# Patient Record
Sex: Female | Born: 1965
Health system: Southern US, Community
[De-identification: ages and names within clinical notes are randomized; demographics above are authoritative.]

## PROBLEM LIST (undated history)

## (undated) DIAGNOSIS — K635 Polyp of colon: Secondary | ICD-10-CM

## (undated) DIAGNOSIS — K219 Gastro-esophageal reflux disease without esophagitis: Secondary | ICD-10-CM

## (undated) DIAGNOSIS — Z800 Family history of malignant neoplasm of digestive organs: Secondary | ICD-10-CM

## (undated) DIAGNOSIS — Q615 Medullary cystic kidney: Secondary | ICD-10-CM

## (undated) DIAGNOSIS — T7840XA Allergy, unspecified, initial encounter: Secondary | ICD-10-CM

## (undated) DIAGNOSIS — I10 Essential (primary) hypertension: Secondary | ICD-10-CM

## (undated) HISTORY — DX: Essential (primary) hypertension: I10

## (undated) HISTORY — DX: Family history of malignant neoplasm of digestive organs: Z80.0

## (undated) HISTORY — DX: Gastro-esophageal reflux disease without esophagitis: K21.9

## (undated) HISTORY — DX: Medullary cystic kidney: Q61.5

## (undated) HISTORY — PX: POLYPECTOMY: SHX149

## (undated) HISTORY — DX: Polyp of colon: K63.5

## (undated) HISTORY — DX: Allergy, unspecified, initial encounter: T78.40XA

---

## 1993-08-23 HISTORY — PX: LAPAROSCOPY: SHX197

## 1998-01-02 ENCOUNTER — Other Ambulatory Visit: Admission: RE | Admit: 1998-01-02 | Discharge: 1998-01-02 | Payer: Self-pay | Admitting: Gynecology

## 1999-06-01 ENCOUNTER — Inpatient Hospital Stay (HOSPITAL_COMMUNITY): Admission: AD | Admit: 1999-06-01 | Discharge: 1999-06-04 | Payer: Self-pay | Admitting: Gynecology

## 1999-06-06 ENCOUNTER — Encounter (HOSPITAL_COMMUNITY): Admission: RE | Admit: 1999-06-06 | Discharge: 1999-09-04 | Payer: Self-pay | Admitting: Gynecology

## 1999-07-13 ENCOUNTER — Other Ambulatory Visit: Admission: RE | Admit: 1999-07-13 | Discharge: 1999-07-13 | Payer: Self-pay | Admitting: Gynecology

## 2000-07-13 ENCOUNTER — Other Ambulatory Visit: Admission: RE | Admit: 2000-07-13 | Discharge: 2000-07-13 | Payer: Self-pay | Admitting: Obstetrics and Gynecology

## 2001-07-18 ENCOUNTER — Other Ambulatory Visit: Admission: RE | Admit: 2001-07-18 | Discharge: 2001-07-18 | Payer: Self-pay | Admitting: Gynecology

## 2002-01-23 ENCOUNTER — Other Ambulatory Visit: Admission: RE | Admit: 2002-01-23 | Discharge: 2002-01-23 | Payer: Self-pay | Admitting: Gynecology

## 2002-06-01 ENCOUNTER — Encounter: Payer: Self-pay | Admitting: Internal Medicine

## 2002-06-01 ENCOUNTER — Ambulatory Visit (HOSPITAL_COMMUNITY): Admission: RE | Admit: 2002-06-01 | Discharge: 2002-06-01 | Payer: Self-pay | Admitting: Internal Medicine

## 2002-06-06 ENCOUNTER — Encounter: Payer: Self-pay | Admitting: Internal Medicine

## 2002-06-06 ENCOUNTER — Ambulatory Visit (HOSPITAL_COMMUNITY): Admission: RE | Admit: 2002-06-06 | Discharge: 2002-06-06 | Payer: Self-pay | Admitting: Internal Medicine

## 2002-08-24 ENCOUNTER — Other Ambulatory Visit: Admission: RE | Admit: 2002-08-24 | Discharge: 2002-08-24 | Payer: Self-pay | Admitting: Gynecology

## 2003-03-07 ENCOUNTER — Other Ambulatory Visit: Admission: RE | Admit: 2003-03-07 | Discharge: 2003-03-07 | Payer: Self-pay | Admitting: Gynecology

## 2003-09-17 ENCOUNTER — Other Ambulatory Visit: Admission: RE | Admit: 2003-09-17 | Discharge: 2003-09-17 | Payer: Self-pay | Admitting: Gynecology

## 2004-02-27 ENCOUNTER — Other Ambulatory Visit: Admission: RE | Admit: 2004-02-27 | Discharge: 2004-02-27 | Payer: Self-pay | Admitting: Gynecology

## 2004-06-30 ENCOUNTER — Ambulatory Visit: Payer: Self-pay | Admitting: Internal Medicine

## 2004-08-31 ENCOUNTER — Encounter: Admission: RE | Admit: 2004-08-31 | Discharge: 2004-08-31 | Payer: Self-pay | Admitting: Gynecology

## 2004-09-17 ENCOUNTER — Other Ambulatory Visit: Admission: RE | Admit: 2004-09-17 | Discharge: 2004-09-17 | Payer: Self-pay | Admitting: Gynecology

## 2005-03-26 ENCOUNTER — Ambulatory Visit (HOSPITAL_COMMUNITY): Admission: RE | Admit: 2005-03-26 | Discharge: 2005-03-26 | Payer: Self-pay | Admitting: Family Medicine

## 2005-09-20 ENCOUNTER — Other Ambulatory Visit: Admission: RE | Admit: 2005-09-20 | Discharge: 2005-09-20 | Payer: Self-pay | Admitting: Gynecology

## 2006-06-21 ENCOUNTER — Encounter: Admission: RE | Admit: 2006-06-21 | Discharge: 2006-06-21 | Payer: Self-pay | Admitting: Gynecology

## 2006-10-03 ENCOUNTER — Other Ambulatory Visit: Admission: RE | Admit: 2006-10-03 | Discharge: 2006-10-03 | Payer: Self-pay | Admitting: Gynecology

## 2007-07-19 ENCOUNTER — Encounter: Admission: RE | Admit: 2007-07-19 | Discharge: 2007-07-19 | Payer: Self-pay | Admitting: Gynecology

## 2007-10-06 ENCOUNTER — Other Ambulatory Visit: Admission: RE | Admit: 2007-10-06 | Discharge: 2007-10-06 | Payer: Self-pay | Admitting: Gynecology

## 2008-08-13 ENCOUNTER — Encounter: Admission: RE | Admit: 2008-08-13 | Discharge: 2008-08-13 | Payer: Self-pay | Admitting: Gynecology

## 2008-09-05 ENCOUNTER — Ambulatory Visit: Payer: Self-pay | Admitting: Cardiology

## 2008-09-11 ENCOUNTER — Encounter: Payer: Self-pay | Admitting: Cardiology

## 2008-09-11 ENCOUNTER — Ambulatory Visit: Payer: Self-pay

## 2008-10-07 ENCOUNTER — Encounter: Payer: Self-pay | Admitting: Gynecology

## 2008-10-07 ENCOUNTER — Other Ambulatory Visit: Admission: RE | Admit: 2008-10-07 | Discharge: 2008-10-07 | Payer: Self-pay | Admitting: Gynecology

## 2008-10-07 ENCOUNTER — Ambulatory Visit: Payer: Self-pay | Admitting: Gynecology

## 2008-11-28 ENCOUNTER — Ambulatory Visit: Payer: Self-pay | Admitting: Gynecology

## 2009-06-11 ENCOUNTER — Ambulatory Visit: Payer: Self-pay | Admitting: Gynecology

## 2009-08-14 ENCOUNTER — Encounter: Admission: RE | Admit: 2009-08-14 | Discharge: 2009-08-14 | Payer: Self-pay | Admitting: Gynecology

## 2009-08-20 ENCOUNTER — Ambulatory Visit: Payer: Self-pay | Admitting: Gynecology

## 2010-01-07 ENCOUNTER — Ambulatory Visit: Payer: Self-pay | Admitting: Gynecology

## 2010-01-07 ENCOUNTER — Other Ambulatory Visit: Admission: RE | Admit: 2010-01-07 | Discharge: 2010-01-07 | Payer: Self-pay | Admitting: Gynecology

## 2010-08-18 ENCOUNTER — Encounter
Admission: RE | Admit: 2010-08-18 | Discharge: 2010-08-18 | Payer: Self-pay | Source: Home / Self Care | Attending: Gynecology | Admitting: Gynecology

## 2011-01-05 NOTE — Assessment & Plan Note (Signed)
[facility]                            CARDIOLOGY OFFICE NOTE   NAME:Stephanie Thomas, Stephanie Thomas                        MRN:          [id number]  DATE:09/05/2008                            DOB:          04/14/1966    PRIMARY CARE PHYSICIAN:  Corrie Mckusick, MD   REASON FOR PRESENTATION:  Evaluate the patient with palpitations and  dyspnea.   HISTORY OF PRESENT ILLNESS:  The patient is a pleasant 45 year old white  female without prior cardiac history.  She noted in November that she  was sick and thought perhaps she had the flu.  Following that in  December, she was no longer able to do the exercise that she had been  capable of prior to that.  She had been quite active, exercising 5 days  a week on a treadmill.  When she tried to do this in December, she was  dyspneic.  She did feel her heart racing.  She could not go nearly as  far as she had been.  At rest, she was not having any of the rapid heart  rates, but she was having fairly frequent skipping.  These would be  unprovoked.  She might have several of these at a time.  There was no  associated presyncope or syncope.  She also started noticing increasing  dyspnea with activity such as caring a 16-year-old child; however, she  was not describing PND or orthopnea.  She thinks her exercise tolerance  has slowly gotten better, but she is still more dyspneic and not quite  at baseline.  Palpitations are not as problematic as they were.  However, she was concerned, given multiple risk factors.  The patient  has had no swelling or weight gain.  She has had no fevers, chills, or  cough.   PAST MEDICAL HISTORY:  She has had borderline hypertension recently.   PAST SURGICAL HISTORY:  Laparoscopy in 1996.   ALLERGIES:  SULFA, TETRACYCLINE, and METRONIDAZOLE.   MEDICATIONS:  Nortrel.   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  She is married.  She has one 30-year-old daughter.  She does not smoke  cigarettes and  never has.  She drinks alcohol rarely.   FAMILY HISTORY:  Remarkable for her father having heart attacks, having  heart disease in his 60s and dying at age 47.  She has 3 sisters, all  with hypertension.  They are all in their 40s.   REVIEW OF SYSTEMS:  Positive for occasional headaches and dizziness.  Negative for all other systems.   PHYSICAL EXAMINATION:  GENERAL:  The patient is in no distress.  VITAL SIGNS:  Blood pressure 157/88, heart rate 80 and regular, weight  117 pounds, and body mass index 22.  HEENT:  Eyes unremarkable.  Pupils are equal, round, and reactive to  light.  Fundi not visualized; oral mucosa unremarkable.  NECK:  No jugular venous distention at 45 degrees, carotid upstroke  brisk and symmetric, no bruits, no thyromegaly.  LYMPHATICS:  No cervical, axillary, or inguinal adenopathy.  LUNGS:  Clear to auscultation bilaterally.  BACK:  No costovertebral angle tenderness.  CHEST:  Unremarkable.  HEART:  PMI not displaced or sustained; S1 and S2 within normal limits;  no S3, no S4; no clicks, no rubs, no murmurs.  ABDOMEN:  Flat; positive bowel sounds, normal in frequency and pitch; no  bruits, no rebound, no guarding; no midline pulsatile mass; no  hepatomegaly, no splenomegaly.  SKIN:  No rashes, no nodules.  EXTREMITIES:  2+ pulses throughout, no edema, no cyanosis, no clubbing.  NEURO:  Oriented to person, place, and time.  Cranial nerves II through  XII grossly intact.  Motor grossly intact.   EKG:  The patient did have an EKG at Dr. Lamar Blinks office.  This  demonstrated poor anterior R-wave progression that may be lead  placement.  I could not exclude an old anterior infarct based on this.   ASSESSMENT AND PLAN:  1. Dyspnea.  The patient's dyspnea may have been related to flu-like      illness and slow recovery from this.  I am going to check a BNP      level.  Because of her strong family history and abnormal EKG, I      will get an  echocardiogram as well.  If these are normal and she      continues to slowly improve, then no further workup would be      planned.  However, if she has any increasing symptoms or      abnormalities, I would have low threshold for stress perfusion      imaging.  2. Palpitations.  These seem to have improved as well.  We discussed      these and if they get worse, I would apply a monitor.  She did have      a TSH and electrolytes, which were normal.  3. Hypertension.  I am concerned about her blood pressure.  She has a      strong family history.  I have asked her to get a blood pressure      cuff.  She can keep a diary and I talked about goals of normal      blood pressure.  4. Risk reduction.  I think this patient with significant risk factors      should get a fasting lipid profile, and I have given her written      instructions to do this.  5. Followup.  I will see the patient back as needed or based on      abnormalities on the above testing.     Rollene Rotunda, MD, [facility]  Electronically Signed    JH/MedQ  DD: 09/05/2008  DT: 09/06/2008  Job #: [id number]   cc:   Corrie Mckusick, M.D.

## 2011-01-14 ENCOUNTER — Other Ambulatory Visit (HOSPITAL_COMMUNITY)
Admission: RE | Admit: 2011-01-14 | Discharge: 2011-01-14 | Disposition: A | Discharge status: Home / Self Care | Payer: 59 | Source: Ambulatory Visit | Attending: Gynecology | Admitting: Gynecology

## 2011-01-14 ENCOUNTER — Encounter (INDEPENDENT_AMBULATORY_CARE_PROVIDER_SITE_OTHER): Payer: 59 | Admitting: Gynecology

## 2011-01-14 ENCOUNTER — Other Ambulatory Visit: Payer: Self-pay | Admitting: Gynecology

## 2011-01-14 DIAGNOSIS — R823 Hemoglobinuria: Secondary | ICD-10-CM

## 2011-01-14 DIAGNOSIS — Z13220 Encounter for screening for lipoid disorders: Secondary | ICD-10-CM

## 2011-01-14 DIAGNOSIS — N920 Excessive and frequent menstruation with regular cycle: Secondary | ICD-10-CM

## 2011-01-14 DIAGNOSIS — Z124 Encounter for screening for malignant neoplasm of cervix: Secondary | ICD-10-CM | POA: Insufficient documentation

## 2011-01-14 DIAGNOSIS — Z01419 Encounter for gynecological examination (general) (routine) without abnormal findings: Secondary | ICD-10-CM

## 2011-01-14 DIAGNOSIS — Z833 Family history of diabetes mellitus: Secondary | ICD-10-CM

## 2011-08-26 ENCOUNTER — Encounter: Payer: Self-pay | Admitting: Internal Medicine

## 2012-02-02 ENCOUNTER — Other Ambulatory Visit: Payer: Self-pay | Admitting: Gynecology

## 2012-02-02 DIAGNOSIS — Z1231 Encounter for screening mammogram for malignant neoplasm of breast: Secondary | ICD-10-CM

## 2012-02-15 ENCOUNTER — Ambulatory Visit
Admission: RE | Admit: 2012-02-15 | Discharge: 2012-02-15 | Disposition: A | Discharge status: Home / Self Care | Payer: 59 | Source: Ambulatory Visit | Attending: Gynecology | Admitting: Gynecology

## 2012-02-15 DIAGNOSIS — Z1231 Encounter for screening mammogram for malignant neoplasm of breast: Secondary | ICD-10-CM

## 2012-04-13 ENCOUNTER — Encounter: Payer: Self-pay | Admitting: Gynecology

## 2012-04-13 ENCOUNTER — Ambulatory Visit (INDEPENDENT_AMBULATORY_CARE_PROVIDER_SITE_OTHER): Payer: 59 | Admitting: Gynecology

## 2012-04-13 VITALS — BP 130/80 | Ht 60.5 in | Wt 125.0 lb

## 2012-04-13 DIAGNOSIS — R635 Abnormal weight gain: Secondary | ICD-10-CM

## 2012-04-13 DIAGNOSIS — Z13220 Encounter for screening for lipoid disorders: Secondary | ICD-10-CM

## 2012-04-13 DIAGNOSIS — L906 Striae atrophicae: Secondary | ICD-10-CM

## 2012-04-13 DIAGNOSIS — Z01419 Encounter for gynecological examination (general) (routine) without abnormal findings: Secondary | ICD-10-CM

## 2012-04-13 LAB — COMPREHENSIVE METABOLIC PANEL
ALT: 11 U/L (ref 0–35)
Alkaline Phosphatase: 64 U/L (ref 39–117)
BUN: 17 mg/dL (ref 6–23)
CO2: 26 mEq/L (ref 19–32)
Calcium: 9.9 mg/dL (ref 8.4–10.5)
Chloride: 101 mEq/L (ref 96–112)
Potassium: 4.6 mEq/L (ref 3.5–5.3)
Total Bilirubin: 0.5 mg/dL (ref 0.3–1.2)

## 2012-04-13 LAB — CBC WITH DIFFERENTIAL/PLATELET
Basophils Relative: 0 % (ref 0–1)
Lymphs Abs: 1.6 10*3/uL (ref 0.7–4.0)
MCH: 30.2 pg (ref 26.0–34.0)
MCHC: 34.3 g/dL (ref 30.0–36.0)
Monocytes Absolute: 0.4 10*3/uL (ref 0.1–1.0)
Monocytes Relative: 6 % (ref 3–12)
Platelets: 245 10*3/uL (ref 150–400)

## 2012-04-13 LAB — LIPID PANEL: HDL: 60 mg/dL (ref >39)

## 2012-04-13 LAB — TSH: TSH: 1.596 u[IU]/mL (ref 0.350–4.500)

## 2012-04-13 NOTE — Progress Notes (Signed)
Stephanie Thomas 22-Jun-1966 [id number]        46 y.o.  G1P1 for annual exam.  Doing well without complaints.  Past medical history,surgical history, medications, allergies, family history and social history were all reviewed and documented in the EPIC chart. ROS:  Was performed and pertinent positives and negatives are included in the history.  Exam: [name] assistant Filed Vitals:   04/13/12 0955  BP: 130/80  Height: 5' 0.5" (1.537 m)  Weight: 125 lb (56.7 kg)   General appearance  Normal Skin several linear red streaks up her right thigh Head/Neck normal with no cervical or supraclavicular adenopathy thyroid normal Lungs  clear Cardiac RR, without RMG Abdominal  soft, nontender, without masses, organomegaly or hernia Breasts  examined lying and sitting without masses, retractions, discharge or axillary adenopathy. Pelvic  Ext/BUS/vagina  normal   Cervix  normal   Uterus  axial, normal size, shape and contour, midline and mobile nontender   Adnexa  Without masses or tenderness    Anus and perineum  normal   Rectovaginal  normal sphincter tone without palpated masses or tenderness.    Assessment/Plan:  46 y.o. G1P1 female for annual exam, vasectomy birth control, regular menses.   1. Red streaks up her right thigh. Patient had seen urgent care and noted these are fading but they told her something about having blood work reference Cushing's. Certainly does not look cushnoid and she notes that these areas are resolving.  No recent tick bites or evidence of infection. No fever chills night sweats. Check baseline labs to include thyroid glucose cortisol. Assuming all negative then plan observation. These areas persist or certainly more areas percent themselves and she will follow up with dermatology for evaluation. 2. Mammography.  Patient had mammogram June 2013. Continued annual mammography. SBE monthly reviewed. 3. Pap smear. Pap smear 2012 normal. No Pap smear done today. She did have to  ASCUS Pap smears in 2002/2003. All of her subsequent annual Pap smears have been normal. We'll plan less frequent screening every 3-5 years. 4. Health maintenance. Baseline CBC lipid profile comprehensive metabolic panel cortisol urinalysis ordered. Assuming negative then will follow up per above. Assuming she continues well from a gynecologic standpoint follow up one year, sooner as needed.    Dara Lords MD, 11:28 AM 04/13/2012

## 2012-04-13 NOTE — Patient Instructions (Signed)
Office will call you with lab results. Follow up with dermatology if the skin areas continue. Follow up for her annual gynecologic exam in one year.

## 2012-04-14 ENCOUNTER — Other Ambulatory Visit: Payer: Self-pay | Admitting: Gynecology

## 2012-04-14 DIAGNOSIS — R3129 Other microscopic hematuria: Secondary | ICD-10-CM

## 2012-04-14 DIAGNOSIS — E7800 Pure hypercholesterolemia, unspecified: Secondary | ICD-10-CM

## 2012-04-14 LAB — URINALYSIS W MICROSCOPIC + REFLEX CULTURE
Bilirubin Urine: NEGATIVE
Glucose, UA: NEGATIVE mg/dL
Ketones, ur: NEGATIVE mg/dL
Nitrite: NEGATIVE
Protein, ur: NEGATIVE mg/dL
Specific Gravity, Urine: 1.026 (ref 1.005–1.030)
pH: 7.5 (ref 5.0–8.0)

## 2012-04-15 LAB — URINE CULTURE

## 2012-04-17 ENCOUNTER — Telehealth: Payer: Self-pay | Admitting: Gynecology

## 2012-04-17 DIAGNOSIS — N390 Urinary tract infection, site not specified: Secondary | ICD-10-CM

## 2012-04-17 MED ORDER — NITROFURANTOIN MONOHYD MACRO 100 MG PO CAPS: 100.0000 mg | ORAL_CAPSULE | Freq: Two times a day (BID) | ORAL | Status: AC

## 2012-04-17 NOTE — Telephone Encounter (Signed)
Tell patient U/A does show UTI.  Rx with macrobid 100 BID X 7.  Recheck UA  After treatment to make sure blood in urine clears.

## 2012-04-17 NOTE — Telephone Encounter (Signed)
Pt informed with the below note, order placed for test of cure.

## 2012-05-05 ENCOUNTER — Other Ambulatory Visit: Payer: 59

## 2012-05-05 DIAGNOSIS — N390 Urinary tract infection, site not specified: Secondary | ICD-10-CM

## 2012-05-05 DIAGNOSIS — E7800 Pure hypercholesterolemia, unspecified: Secondary | ICD-10-CM

## 2012-05-05 LAB — LIPID PANEL
Cholesterol: 164 mg/dL (ref 0–200)
HDL: 59 mg/dL (ref >39)
LDL Cholesterol: 84 mg/dL (ref 0–99)
Total CHOL/HDL Ratio: 2.8 Ratio
VLDL: 21 mg/dL (ref 0–40)

## 2012-05-06 LAB — URINALYSIS W MICROSCOPIC + REFLEX CULTURE
Bilirubin Urine: NEGATIVE
Casts: NONE SEEN
Crystals: NONE SEEN
Glucose, UA: NEGATIVE mg/dL
Ketones, ur: NEGATIVE mg/dL
Leukocytes, UA: NEGATIVE
Nitrite: NEGATIVE
Protein, ur: NEGATIVE mg/dL
Specific Gravity, Urine: 1.007 (ref 1.005–1.030)
Squamous Epithelial / LPF: NONE SEEN
Urobilinogen, UA: 0.2 mg/dL (ref 0.0–1.0)
pH: 7 (ref 5.0–8.0)

## 2012-05-11 ENCOUNTER — Telehealth: Payer: Self-pay | Admitting: *Deleted

## 2012-05-11 NOTE — Telephone Encounter (Signed)
Pt informed with recent lab 05/05/12. Chol and u/a results

## 2012-05-17 ENCOUNTER — Encounter: Payer: Self-pay | Admitting: Internal Medicine

## 2012-08-23 HISTORY — PX: COLONOSCOPY: SHX174

## 2013-01-12 ENCOUNTER — Other Ambulatory Visit: Payer: Self-pay

## 2013-01-12 ENCOUNTER — Encounter: Payer: Self-pay | Admitting: Internal Medicine

## 2013-01-12 DIAGNOSIS — Z1231 Encounter for screening mammogram for malignant neoplasm of breast: Secondary | ICD-10-CM

## 2013-02-15 ENCOUNTER — Ambulatory Visit: Payer: 59

## 2013-03-08 ENCOUNTER — Ambulatory Visit
Admission: RE | Admit: 2013-03-08 | Discharge: 2013-03-08 | Disposition: A | Discharge status: Home / Self Care | Payer: 59 | Source: Ambulatory Visit

## 2013-03-08 DIAGNOSIS — Z1231 Encounter for screening mammogram for malignant neoplasm of breast: Secondary | ICD-10-CM

## 2013-03-19 ENCOUNTER — Ambulatory Visit (AMBULATORY_SURGERY_CENTER): Payer: 59

## 2013-03-19 VITALS — Ht 61.0 in | Wt 121.0 lb

## 2013-03-19 DIAGNOSIS — Z800 Family history of malignant neoplasm of digestive organs: Secondary | ICD-10-CM

## 2013-03-19 MED ORDER — MOVIPREP 100 G PO SOLR: 1.0000 | Freq: Once | ORAL | Status: DC

## 2013-03-21 ENCOUNTER — Encounter: Payer: Self-pay | Admitting: Internal Medicine

## 2013-03-30 ENCOUNTER — Encounter: Payer: Self-pay | Admitting: Internal Medicine

## 2013-03-30 ENCOUNTER — Ambulatory Visit (AMBULATORY_SURGERY_CENTER): Payer: 59 | Admitting: Internal Medicine

## 2013-03-30 VITALS — BP 100/61 | HR 68 | Temp 97.4°F | Resp 28 | Ht 61.0 in | Wt 121.0 lb

## 2013-03-30 DIAGNOSIS — D126 Benign neoplasm of colon, unspecified: Secondary | ICD-10-CM

## 2013-03-30 DIAGNOSIS — K635 Polyp of colon: Secondary | ICD-10-CM

## 2013-03-30 DIAGNOSIS — Z800 Family history of malignant neoplasm of digestive organs: Secondary | ICD-10-CM

## 2013-03-30 HISTORY — DX: Polyp of colon: K63.5

## 2013-03-30 MED ORDER — SODIUM CHLORIDE 0.9 % IV SOLN: 500.0000 mL | INTRAVENOUS | Status: DC

## 2013-03-30 NOTE — Op Note (Signed)
 [facility] [address] [place], [address]   COLONOSCOPY PROCEDURE REPORT  PATIENT: Stephanie Thomas, Stephanie Thomas  MR#: [id number] BIRTHDATE: 14-Feb-1966 , 46  yrs. old GENDER: Female ENDOSCOPIST: Hart Carwin, MD REFERRED XB:JYNW Phillips Odor, M.D. PROCEDURE DATE:  03/30/2013 PROCEDURE:   Colonoscopy with cold biopsy polypectomy First Screening Colonoscopy - Avg.  risk and is 50 yrs.  old or older Yes.  Prior Negative Screening - Now for repeat screening. N/A  History of Adenoma - Now for follow-up colonoscopy & has been > or = to 3 yrs.  Yes hx of adenoma.  Has been 3 or more years since last colonoscopy.  Polyps Removed Today? Yes. ASA CLASS:   Class I INDICATIONS:Patient's immediate family history of colon cancer. parent, also sister hac Crohn's dosease, prior colonoscopies in 1993( polyp), 1999, 2005 MEDICATIONS: MAC sedation, administered by CRNA and Propofol (Diprivan) 260 mg IV  DESCRIPTION OF PROCEDURE:   After the risks benefits and alternatives of the procedure were thoroughly explained, informed consent was obtained.  A digital rectal exam revealed no abnormalities of the rectum.   The LB 1528  endoscope was introduced through the anus and advanced to the cecum, which was identified by both the appendix and ileocecal valve. No adverse events experienced.   The quality of the prep was good, using MoviPrep  The instrument was then slowly withdrawn as the colon was fully examined.      COLON FINDINGS: A diminutive sessile polyp was found in the rectum. A polypectomy was performed with cold forceps.  The resection was complete and the polyp tissue was completely retrieved. Retroflexed views revealed no abnormalities. The time to cecum=6 minutes 15 seconds.  Withdrawal time=6 minutes 18 seconds.  The scope was withdrawn and the procedure completed. COMPLICATIONS: There were no complications.  ENDOSCOPIC IMPRESSION: Diminutive sessile polyp was found in the  rectum; polypectomy was performed with cold forceps  RECOMMENDATIONS: 1.  Await pathology results 2.  high fiber diet   eSigned:  Hart Carwin, MD 03/30/2013 10:40 AM   cc:   PATIENT NAME:  Henna, Derderian MR#: [id number]

## 2013-03-30 NOTE — Progress Notes (Signed)
Patient did not experience any of the following events: a burn prior to discharge; a fall within the facility; wrong site/side/patient/procedure/implant event; or a hospital transfer or hospital admission upon discharge from the facility. (G8907) Patient did not have preoperative order for IV antibiotic SSI prophylaxis. (G8918)  

## 2013-03-30 NOTE — Patient Instructions (Signed)
YOU HAD AN ENDOSCOPIC PROCEDURE TODAY AT THE [facility]: Refer to the procedure report that was given to you for any specific questions about what was found during the examination.  If the procedure report does not answer your questions, please call your gastroenterologist to clarify.  If you requested that your care partner not be given the details of your procedure findings, then the procedure report has been included in a sealed envelope for you to review at your convenience later.  YOU SHOULD EXPECT: Some feelings of bloating in the abdomen. Passage of more gas than usual.  Walking can help get rid of the air that was put into your GI tract during the procedure and reduce the bloating. If you had a lower endoscopy (such as a colonoscopy or flexible sigmoidoscopy) you may notice spotting of blood in your stool or on the toilet paper. If you underwent a bowel prep for your procedure, then you may not have a normal bowel movement for a few days.  DIET: Your first meal following the procedure should be a light meal and then it is ok to progress to your normal diet.  A half-sandwich or bowl of soup is an example of a good first meal.  Heavy or fried foods are harder to digest and may make you feel nauseous or bloated.  Likewise meals heavy in dairy and vegetables can cause extra gas to form and this can also increase the bloating.  Drink plenty of fluids but you should avoid alcoholic beverages for 24 hours.  ACTIVITY: Your care partner should take you home directly after the procedure.  You should plan to take it easy, moving slowly for the rest of the day.  You can resume normal activity the day after the procedure however you should NOT DRIVE or use heavy machinery for 24 hours (because of the sedation medicines used during the test).    SYMPTOMS TO REPORT IMMEDIATELY: A gastroenterologist can be reached at any hour.  During normal business hours, 8:30 AM to 5:00 PM Monday through Friday,  call [phone].  After hours and on weekends, please call the GI answering service at [phone] who will take a message and have the physician on call contact you.   Following lower endoscopy (colonoscopy or flexible sigmoidoscopy):  Excessive amounts of blood in the stool  Significant tenderness or worsening of abdominal pains  Swelling of the abdomen that is new, acute  Fever of 100F or higher    FOLLOW UP: If any biopsies were taken you will be contacted by phone or by letter within the next 1-3 weeks.  Call your gastroenterologist if you have not heard about the biopsies in 3 weeks.  Our staff will call the home number listed on your records the next business day following your procedure to check on you and address any questions or concerns that you may have at that time regarding the information given to you following your procedure. This is a courtesy call and so if there is no answer at the home number and we have not heard from you through the emergency physician on call, we will assume that you have returned to your regular daily activities without incident.  SIGNATURES/CONFIDENTIALITY: You and/or your care partner have signed paperwork which will be entered into your electronic medical record.  These signatures attest to the fact that that the information above on your After Visit Summary has been reviewed and is understood.  Full responsibility of the confidentiality   of this discharge information lies with you and/or your care-partner.   Information on polyps & high fiber diet  given to you today    

## 2013-03-30 NOTE — Progress Notes (Signed)
Called to room to assist during endoscopic procedure.  Patient ID and intended procedure confirmed with present staff. Received instructions for my participation in the procedure from the performing physician.  

## 2013-03-30 NOTE — Progress Notes (Signed)
Procedure ends, to recovery, report given and VSS. 

## 2013-04-02 ENCOUNTER — Telehealth: Payer: Self-pay | Admitting: *Deleted

## 2013-04-02 NOTE — Telephone Encounter (Signed)
  Follow up Call-  Call back number 03/30/2013 03/30/2013  Post procedure Call Back phone  # [phone]  Permission to leave phone message Yes Yes     Patient questions:  Do you have a fever, pain , or abdominal swelling? no Pain Score  0 *  Have you tolerated food without any problems? yes  Have you been able to return to your normal activities? yes  Do you have any questions about your discharge instructions: Diet   no Medications  no Follow up visit  no  Do you have questions or concerns about your Care? no  Actions: * If pain score is 4 or above: No action needed, pain <4.

## 2013-04-03 ENCOUNTER — Encounter: Payer: Self-pay | Admitting: Internal Medicine

## 2013-04-09 ENCOUNTER — Telehealth: Payer: Self-pay | Admitting: Internal Medicine

## 2013-04-09 NOTE — Telephone Encounter (Signed)
Spoke with patient and gave her Dr. Brodie's recommendation.  

## 2013-04-09 NOTE — Telephone Encounter (Signed)
Patient reports that after her colonoscopy on 03/30/13, she had lots of gas and diarrhea. She saw her PCP on 04/05/13 and he suggested a probiotic which she did not take. She then developed right side back pain, heartburn, diarrhea and chest pain. She took Imodium on Saturday and the diarrhea went away. She changed her diet to a more bland diet. Her symptoms have now all gone away. She is thinking may it was her gall bladder. She wants to know if the symptoms return should she call Dr. Juanda Chance or her PCP.

## 2013-04-09 NOTE — Telephone Encounter (Signed)
Yes, she should call us.She had a normal HIDA scan in 2003 but it has been 10 years,  We would set her up for an abdominal ultrasound.

## 2013-04-16 ENCOUNTER — Encounter: Payer: 59 | Admitting: Gynecology

## 2013-04-24 ENCOUNTER — Other Ambulatory Visit (HOSPITAL_COMMUNITY): Payer: Self-pay | Admitting: Family Medicine

## 2013-04-24 DIAGNOSIS — K8020 Calculus of gallbladder without cholecystitis without obstruction: Secondary | ICD-10-CM

## 2013-04-24 DIAGNOSIS — R1011 Right upper quadrant pain: Secondary | ICD-10-CM

## 2013-04-25 ENCOUNTER — Ambulatory Visit (HOSPITAL_COMMUNITY)
Admission: RE | Admit: 2013-04-25 | Discharge: 2013-04-25 | Disposition: A | Discharge status: Home / Self Care | Payer: 59 | Source: Ambulatory Visit | Attending: Family Medicine | Admitting: Family Medicine

## 2013-04-25 DIAGNOSIS — R1011 Right upper quadrant pain: Secondary | ICD-10-CM

## 2013-04-25 DIAGNOSIS — R109 Unspecified abdominal pain: Secondary | ICD-10-CM | POA: Insufficient documentation

## 2013-04-25 DIAGNOSIS — K8020 Calculus of gallbladder without cholecystitis without obstruction: Secondary | ICD-10-CM

## 2013-04-25 DIAGNOSIS — R160 Hepatomegaly, not elsewhere classified: Secondary | ICD-10-CM | POA: Insufficient documentation

## 2013-05-03 ENCOUNTER — Other Ambulatory Visit (HOSPITAL_COMMUNITY): Payer: Self-pay | Admitting: Family Medicine

## 2013-05-03 DIAGNOSIS — R109 Unspecified abdominal pain: Secondary | ICD-10-CM

## 2013-05-08 ENCOUNTER — Encounter (HOSPITAL_COMMUNITY): Payer: Self-pay

## 2013-05-08 ENCOUNTER — Encounter (HOSPITAL_COMMUNITY)
Admission: RE | Admit: 2013-05-08 | Discharge: 2013-05-08 | Disposition: A | Discharge status: Home / Self Care | Payer: 59 | Source: Ambulatory Visit | Attending: Family Medicine | Admitting: Family Medicine

## 2013-05-08 DIAGNOSIS — R109 Unspecified abdominal pain: Secondary | ICD-10-CM | POA: Insufficient documentation

## 2013-05-08 MED ORDER — TECHNETIUM TC 99M MEBROFENIN IV KIT
5.0000 | PACK | Freq: Once | INTRAVENOUS | Status: AC | PRN
  Administered 2013-05-08: 5 via INTRAVENOUS

## 2013-06-06 ENCOUNTER — Encounter: Payer: Self-pay | Admitting: Gynecology

## 2013-07-09 ENCOUNTER — Encounter: Payer: Self-pay | Admitting: Gynecology

## 2013-08-15 ENCOUNTER — Ambulatory Visit (INDEPENDENT_AMBULATORY_CARE_PROVIDER_SITE_OTHER): Payer: 59 | Admitting: Gynecology

## 2013-08-15 ENCOUNTER — Other Ambulatory Visit (HOSPITAL_COMMUNITY)
Admission: RE | Admit: 2013-08-15 | Discharge: 2013-08-15 | Disposition: A | Discharge status: Home / Self Care | Payer: 59 | Source: Ambulatory Visit | Attending: Gynecology | Admitting: Gynecology

## 2013-08-15 ENCOUNTER — Encounter: Payer: Self-pay | Admitting: Gynecology

## 2013-08-15 VITALS — BP 124/80 | Ht 61.0 in | Wt 127.4 lb

## 2013-08-15 DIAGNOSIS — Z01419 Encounter for gynecological examination (general) (routine) without abnormal findings: Secondary | ICD-10-CM | POA: Insufficient documentation

## 2013-08-15 DIAGNOSIS — N930 Postcoital and contact bleeding: Secondary | ICD-10-CM

## 2013-08-15 DIAGNOSIS — Z1151 Encounter for screening for human papillomavirus (HPV): Secondary | ICD-10-CM | POA: Insufficient documentation

## 2013-08-15 DIAGNOSIS — N926 Irregular menstruation, unspecified: Secondary | ICD-10-CM

## 2013-08-15 DIAGNOSIS — IMO0002 Reserved for concepts with insufficient information to code with codable children: Secondary | ICD-10-CM

## 2013-08-15 LAB — CBC WITH DIFFERENTIAL/PLATELET
Basophils Absolute: 0 10*3/uL (ref 0.0–0.1)
HCT: 39.2 % (ref 36.0–46.0)
Lymphocytes Relative: 24 % (ref 12–46)
Lymphs Abs: 1.5 10*3/uL (ref 0.7–4.0)
Neutro Abs: 4.2 10*3/uL (ref 1.7–7.7)
Platelets: 255 10*3/uL (ref 150–400)
RBC: 4.4 MIL/uL (ref 3.87–5.11)
RDW: 13.3 % (ref 11.5–15.5)
WBC: 6.2 10*3/uL (ref 4.0–10.5)

## 2013-08-15 LAB — COMPREHENSIVE METABOLIC PANEL
ALT: 15 U/L (ref 0–35)
AST: 20 U/L (ref 0–37)
Albumin: 4.5 g/dL (ref 3.5–5.2)
CO2: 24 mEq/L (ref 19–32)
Calcium: 9.5 mg/dL (ref 8.4–10.5)
Chloride: 102 mEq/L (ref 96–112)
Potassium: 3.9 mEq/L (ref 3.5–5.3)
Total Protein: 6.8 g/dL (ref 6.0–8.3)

## 2013-08-15 LAB — FOLLICLE STIMULATING HORMONE: FSH: 37.1 m[IU]/mL

## 2013-08-15 LAB — LIPID PANEL
LDL Cholesterol: 73 mg/dL (ref 0–99)
Triglycerides: 148 mg/dL (ref <150)
VLDL: 30 mg/dL (ref 0–40)

## 2013-08-15 LAB — PROLACTIN: Prolactin: 7.3 ng/mL

## 2013-08-15 NOTE — Progress Notes (Signed)
Stephanie Thomas 07/05/1966 [id number]        47 y.o.  G1P1 for annual exam.  Several issues that are below.  Past medical history,surgical history, problem list, medications, allergies, family history and social history were all reviewed and documented in the EPIC chart.  ROS:  Performed and pertinent positives and negatives are included in the history, assessment and plan .  Exam: Sherrilyn Rist assistant Filed Vitals:   08/15/13 1056  BP: 124/80  Height: 5\' 1"  (1.549 m)  Weight: 127 lb 6.4 oz (57.788 kg)   General appearance  Normal Skin grossly normal Head/Neck normal with no cervical or supraclavicular adenopathy thyroid normal Lungs  clear Cardiac RR, without RMG Abdominal  soft, nontender, without masses, organomegaly or hernia Breasts  examined lying and sitting without masses, retractions, discharge or axillary adenopathy. Pelvic  Ext/BUS/vagina  Normal   Cervix  Normal, GC/chlamydia, Pap/HPV  Uterus  anteverted, normal size, shape and contour, midline and mobile nontender   Adnexa  Without masses or tenderness    Anus and perineum  Normal   Rectovaginal  Normal sphincter tone without palpated masses or tenderness.    Assessment/Plan:  47 y.o. G1P1 female for annual exam, irregular menses, vasectomy birth control.   1. Irregular menses. Patient notes she will skip a month on and off. No significant intermenstrual bleeding. Does have some hot flashes. No night sweats. Will check baseline labs to include FSH TSH prolactin. Also sonohysterogram to evaluate along with #2. 2. Post coital bleeding/dyspareunia. Patient notes consistent post coital bleeding. No spotting otherwise. Also having consistent right-sided dyspareunia. Not having pain otherwise. Exam is normal. Start with ultrasound rule out ovarian process as well as endometrial defects. Pap/HPV and GC/chlamydia screen done due to the bleeding history. No overt evidence of vaginal discharge or vaginitis. 3. Pap smear 2012. Pap/HPV  done today per #2. No history of abnormal Pap smears previously. 4. Mammography 02/2013. Continue with annual mammography. SBE monthly reviewed. 5. Health maintenance. Baseline CBC comprehensive metabolic panel lipid profile urinalysis ordered along with the above blood work. Followup for ultrasound and blood results. Will discuss treatment plan following these results.   Note: This document was prepared with digital dictation and possible smart phrase technology. Any transcriptional errors that result from this process are unintentional.   Dara Lords MD, 11:27 AM 08/15/2013

## 2013-08-15 NOTE — Patient Instructions (Signed)
Followup for ultrasound as scheduled and blood work results. 

## 2013-08-16 LAB — URINALYSIS W MICROSCOPIC + REFLEX CULTURE
Bilirubin Urine: NEGATIVE
Nitrite: NEGATIVE
Protein, ur: NEGATIVE mg/dL
Urobilinogen, UA: 0.2 mg/dL (ref 0.0–1.0)

## 2013-08-27 ENCOUNTER — Other Ambulatory Visit: Payer: Self-pay | Admitting: Gynecology

## 2013-08-27 DIAGNOSIS — N949 Unspecified condition associated with female genital organs and menstrual cycle: Secondary | ICD-10-CM

## 2013-08-27 DIAGNOSIS — N926 Irregular menstruation, unspecified: Secondary | ICD-10-CM

## 2013-08-27 DIAGNOSIS — N930 Postcoital and contact bleeding: Secondary | ICD-10-CM

## 2013-08-27 DIAGNOSIS — N925 Other specified irregular menstruation: Secondary | ICD-10-CM

## 2013-09-04 ENCOUNTER — Encounter: Payer: Self-pay | Admitting: Gynecology

## 2013-09-12 ENCOUNTER — Other Ambulatory Visit: Payer: 59

## 2013-09-12 ENCOUNTER — Ambulatory Visit: Payer: 59 | Admitting: Gynecology

## 2013-09-20 ENCOUNTER — Telehealth: Payer: Self-pay | Admitting: *Deleted

## 2013-09-20 NOTE — Telephone Encounter (Signed)
If she no longer is doing any bleeding then I would recommend keeping a menstrual calendar and as long as less frequent but regular menses then we can watch. If she has prolonged or atypical bleeding that she needs to schedule the sonohysterogram. The elevated FSH tells me that she is entering menopause and she can anticipate skips in her menses up to and including stopping altogether. If she has significant symptoms to include hot flashes sweats and she wants to discuss treatment options and recommend office visit.

## 2013-09-20 NOTE — Telephone Encounter (Signed)
Pt had [facility] scheduled on 09/26/13 due to some post coital bleeding from OV 08/15/13, pt said this has stopped and canceled appointment. Plus it would cost $200 out of pocket to have procedure done. Her H[place] was elevated and this was going to be discussed at [facility] appointment, asked if you still would like OV to discuss elevated FSH? Please advise

## 2013-09-20 NOTE — Telephone Encounter (Signed)
Pt informed with the below note, will follow up as needed 

## 2013-09-26 ENCOUNTER — Ambulatory Visit: Payer: 59 | Admitting: Gynecology

## 2013-09-26 ENCOUNTER — Other Ambulatory Visit: Payer: 59

## 2013-10-17 ENCOUNTER — Other Ambulatory Visit: Payer: Self-pay | Admitting: Gynecology

## 2013-10-17 ENCOUNTER — Telehealth: Payer: Self-pay

## 2013-10-17 MED ORDER — VENLAFAXINE HCL ER 37.5 MG PO CP24: 37.5000 mg | ORAL_CAPSULE | Freq: Every day | ORAL | Status: DC

## 2013-10-17 NOTE — Telephone Encounter (Signed)
Patient said her last test results showed she was "starting menopause". On top of that her mom was just sent home from the hospital with Hospice.  She said she has been having some issues with anxiety and wondered if there is anything you can give her or recommend to help her deal with this anxiety.

## 2013-10-17 NOTE — Telephone Encounter (Signed)
Patiet advised of all of this. I read her Dr. Zelphia Cairo note.  She understands. She will try this and call in several weeks to report. Rx sent.

## 2013-10-17 NOTE — Telephone Encounter (Signed)
Recommend trial of Effexor XR 37.5 mg. Call after several weeks. We can then increase to a higher dose if needed. Side effects to watch for include fatigue/lethargy. Suicide ideations with depressive thoughts need to be reported ASAP. Rare event. #30 and then have her call in several weeks to see how she's doing.

## 2013-12-07 ENCOUNTER — Telehealth: Payer: Self-pay

## 2013-12-07 NOTE — Telephone Encounter (Signed)
Patient advised.

## 2013-12-07 NOTE — Telephone Encounter (Signed)
I think is a good idea to wean off. I would take the pill every other day for a week and then every third day for week and then stop it.

## 2013-12-07 NOTE — Telephone Encounter (Signed)
Patient called on 10/17/13 with anxiety issues and mom going into Hospice and asking for Rx. She was prescribed Effexor and has been on it since.  She said she now feels ready to come off of it but wonders if she needs to "wean" off of it?

## 2014-01-23 ENCOUNTER — Telehealth: Payer: Self-pay | Admitting: Internal Medicine

## 2014-01-23 ENCOUNTER — Encounter: Payer: Self-pay | Admitting: *Deleted

## 2014-01-23 NOTE — Telephone Encounter (Signed)
Left a message for patient to call me. 

## 2014-01-23 NOTE — Telephone Encounter (Signed)
Patient states she had a work up last year for gall bladder that was negative. For the last week, she has been having pain on the opposite side from last year mostly when she eats below her ribs that goes to her chest and back. Also , reports bad reflux. She will call her PCP and let them know about this pain also since it is chest pain too. Scheduled with Nicoletta Ba, PA on 01/24/14 at 3:00 PM.

## 2014-01-23 NOTE — Telephone Encounter (Signed)
Please start Prilosec 40 mg po qd, #30, 1 refill

## 2014-01-24 ENCOUNTER — Ambulatory Visit: Payer: 59 | Admitting: Physician Assistant

## 2014-01-24 MED ORDER — OMEPRAZOLE 40 MG PO CPDR: 40.0000 mg | DELAYED_RELEASE_CAPSULE | Freq: Every day | ORAL | Status: DC

## 2014-01-24 NOTE — Telephone Encounter (Signed)
Spoke with patient and rx sent to pharmacy. 

## 2014-01-25 ENCOUNTER — Other Ambulatory Visit (HOSPITAL_COMMUNITY): Payer: Self-pay | Admitting: Family Medicine

## 2014-01-25 ENCOUNTER — Ambulatory Visit (HOSPITAL_COMMUNITY)
Admission: RE | Admit: 2014-01-25 | Discharge: 2014-01-25 | Disposition: A | Discharge status: Home / Self Care | Payer: 59 | Source: Ambulatory Visit | Attending: Family Medicine | Admitting: Family Medicine

## 2014-01-25 DIAGNOSIS — R079 Chest pain, unspecified: Secondary | ICD-10-CM | POA: Insufficient documentation

## 2014-01-28 ENCOUNTER — Ambulatory Visit (INDEPENDENT_AMBULATORY_CARE_PROVIDER_SITE_OTHER): Payer: 59 | Admitting: Gastroenterology

## 2014-01-28 ENCOUNTER — Encounter: Payer: Self-pay | Admitting: Gastroenterology

## 2014-01-28 VITALS — BP 120/70 | HR 80 | Ht 61.0 in | Wt 130.2 lb

## 2014-01-28 DIAGNOSIS — R1013 Epigastric pain: Secondary | ICD-10-CM

## 2014-01-28 DIAGNOSIS — K219 Gastro-esophageal reflux disease without esophagitis: Secondary | ICD-10-CM

## 2014-01-28 NOTE — Patient Instructions (Signed)
You have been scheduled for an endoscopy with propofol. Please follow written instructions given to you at your visit today. If you use inhalers (even only as needed), please bring them with you on the day of your procedure. Your physician has requested that you go to www.startemmi.com and enter the access code given to you at your visit today. This web site gives a general overview about your procedure. However, you should still follow specific instructions given to you by our office regarding your preparation for the procedure.  Continue your Omeprazole

## 2014-01-28 NOTE — Progress Notes (Signed)
     01/28/2014 Stephanie Thomas [id number] 11-07-1965   History of Present Illness:  This is a 48 year old female who is known to Dr. Olevia Perches for previous colonoscopies, most recently in August 2014 at which time she had a very small hyperplastic polyp removed from the rectum.  She presents to our office today with complaints of reflux and burning discomfort in her epigastrium and into her chest, particularly after eating. The pain is constant since it became 1.5 weeks ago.  She admits to some nausea, which she describes as a constant feeling of hunger. She admits to excessive belching.  She denies any vomiting. She denies any difficult or painful swallowing.  She reports that she had similar symptoms in September 2014 and at that time she underwent an ultrasound of the abdomen which revealed mildly enlarged liver but was otherwise unremarkable. She also had a HIDA scan performed, which was negative for gallbladder issues. Symptoms resolved at that time and they were not present again until as stated above, approximately 1.5 weeks ago. She called our office and a prescription for omeprazole 40 mg daily was sent to her pharmacy; she just started that yesterday. She said that today she actually has been feeling better.  She admits to using Goodie powders approximately once a week for headaches   Current Medications, Allergies, Past Medical History, Past Surgical History, Family History and Social History were reviewed in [employer] record.   Physical Exam: BP 120/70  Pulse 80  Ht 5\' 1"  (1.549 m)  Wt 130 lb 3.2 oz (59.058 kg)  BMI 24.61 kg/m2  LMP 01/15/2014 General: Well developed white female in no acute distress Head: Normocephalic and atraumatic Eyes:  Sclerae anicteric, conjunctiva pink  Ears: Normal auditory acuity Lungs: Clear throughout to auscultation Heart: Regular rate and rhythm Abdomen: Soft, non-distended.  Normal bowel sounds.  Non-tender. Musculoskeletal:  Symmetrical with no gross deformities  Extremities: No edema  Neurological: Alert oriented x 4, grossly non-focal Psychological:  Alert and cooperative. Normal mood and affect  Assessment and Recommendations: -Epigastric abdominal pain with GERD and belching:  Had negative gallbladder evaluation when she had similar symptoms last year.  Just started omeprazole 40 mg daily yesterday and she will continue that.  Will schedule her for EGD for further evaluation to rule out ulcer disease, etc.  The risks, benefits, and alternatives were discussed with the patient and she consents to proceed.  She does use Goodie powders approximately once a week and I have asked her to discontinue using those.

## 2014-01-28 NOTE — Progress Notes (Signed)
Reviewed and agree. I have an opening for EGD on Wed 01/30/2014 at 8.30 am., please tell Pam

## 2014-02-04 ENCOUNTER — Ambulatory Visit: Payer: 59 | Admitting: Internal Medicine

## 2014-02-05 ENCOUNTER — Encounter: Payer: 59 | Admitting: Internal Medicine

## 2014-02-06 ENCOUNTER — Ambulatory Visit (AMBULATORY_SURGERY_CENTER): Payer: 59 | Admitting: Internal Medicine

## 2014-02-06 ENCOUNTER — Encounter: Payer: Self-pay | Admitting: Internal Medicine

## 2014-02-06 VITALS — BP 124/77 | HR 63 | Temp 99.1°F | Resp 22 | Ht 61.0 in | Wt 130.0 lb

## 2014-02-06 DIAGNOSIS — K2960 Other gastritis without bleeding: Secondary | ICD-10-CM

## 2014-02-06 DIAGNOSIS — K319 Disease of stomach and duodenum, unspecified: Secondary | ICD-10-CM

## 2014-02-06 DIAGNOSIS — K219 Gastro-esophageal reflux disease without esophagitis: Secondary | ICD-10-CM

## 2014-02-06 DIAGNOSIS — R1013 Epigastric pain: Secondary | ICD-10-CM

## 2014-02-06 MED ORDER — SODIUM CHLORIDE 0.9 % IV SOLN: 500.0000 mL | INTRAVENOUS | Status: DC

## 2014-02-06 NOTE — Progress Notes (Signed)
Report to PACU, RN, vss, BBS= Clear.  

## 2014-02-06 NOTE — Progress Notes (Signed)
Called to room to assist during endoscopic procedure.  Patient ID and intended procedure confirmed with present staff. Received instructions for my participation in the procedure from the performing physician.  

## 2014-02-06 NOTE — Op Note (Signed)
[facility]  [address] [place], [address]   ENDOSCOPY PROCEDURE REPORT  PATIENT: Stephanie, Thomas  MR#: [id number] BIRTHDATE: 1965-09-04 , 63  yrs. old GENDER: Female ENDOSCOPIST: Lafayette Dragon, MD REFERRED BY:  Sharilyn Sites, M.D. PROCEDURE DATE:  02/06/2014 PROCEDURE:  EGD w/ biopsy ASA CLASS:     Class I INDICATIONS:  Epigastric pain.   pt has taken [employer]. MEDICATIONS: MAC sedation, administered by CRNA and Propofol (Diprivan) 160 mg IV TOPICAL ANESTHETIC: none  DESCRIPTION OF PROCEDURE: After the risks benefits and alternatives of the procedure were thoroughly explained, informed consent was obtained.  The LB [id number] endoscope was introduced through the mouth and advanced to the second portion of the duodenum. Without limitations.  The instrument was slowly withdrawn as the mucosa was fully examined.      Esophagus:  proximal, mid and distal esophageal mucosa was normal. There was no esophageal stricture or esophagitis. The squamocolumnar junction was normal Stomach: body of the stomach appeared normal with normal rugal pattern. There were multiple erosions in the gastric antrum some of them covered with exudate. There were consistent with erosive gastritis. Multiple biopsies were obtained to rule out H. pylori. Pyloric outlet was normal. Retroflexion of the scope revealed a normal fundus and cardia,no significant hiatal hernia Duodenum:  duodenal bulb and descending duodenum was normal[ The scope was then withdrawn from the patient and the procedure completed.  COMPLICATIONS: There were no complications. ENDOSCOPIC IMPRESSION:  moderately severe antral gastritis likely from aspirin, status post biopsies to rule out H. pylori RECOMMENDATIONS: 1.  Anti-reflux regimen to be follow 2.  Await pathology results 3.  Continue PPI 4.  avoid Googy powders  REPEAT EXAM:  eSigned:  Lafayette Dragon, MD 02/06/2014 11:13  AM   CC:  PATIENT NAME:  Stephanie, Thomas MR#: [id number]

## 2014-02-06 NOTE — Patient Instructions (Signed)

## 2014-02-07 ENCOUNTER — Telehealth: Payer: Self-pay | Admitting: *Deleted

## 2014-02-07 NOTE — Telephone Encounter (Signed)
  Follow up Call-  Call back number 02/06/2014 03/30/2013 03/30/2013  Post procedure Call Back phone  # [phone]  Permission to leave phone message Yes Yes Yes     Patient questions:  Do you have a fever, pain , or abdominal swelling? no Pain Score  0 *  Have you tolerated food without any problems? yes  Have you been able to return to your normal activities? yes  Do you have any questions about your discharge instructions: Diet   no Medications  no Follow up visit  no  Do you have questions or concerns about your Care? no  Actions: * If pain score is 4 or above: No action needed, pain <4.

## 2014-02-11 ENCOUNTER — Other Ambulatory Visit: Payer: Self-pay

## 2014-02-11 DIAGNOSIS — Z1231 Encounter for screening mammogram for malignant neoplasm of breast: Secondary | ICD-10-CM

## 2014-02-13 ENCOUNTER — Encounter: Payer: Self-pay | Admitting: Internal Medicine

## 2014-02-26 ENCOUNTER — Ambulatory Visit: Payer: 59 | Admitting: Cardiology

## 2014-03-11 ENCOUNTER — Ambulatory Visit
Admission: RE | Admit: 2014-03-11 | Discharge: 2014-03-11 | Disposition: A | Discharge status: Home / Self Care | Payer: 59 | Source: Ambulatory Visit

## 2014-03-11 DIAGNOSIS — Z1231 Encounter for screening mammogram for malignant neoplasm of breast: Secondary | ICD-10-CM

## 2014-04-02 ENCOUNTER — Ambulatory Visit: Payer: 59

## 2014-05-08 ENCOUNTER — Encounter: Payer: Self-pay | Admitting: Gynecology

## 2014-06-12 ENCOUNTER — Ambulatory Visit (INDEPENDENT_AMBULATORY_CARE_PROVIDER_SITE_OTHER): Payer: 59 | Admitting: Gynecology

## 2014-06-12 ENCOUNTER — Encounter: Payer: Self-pay | Admitting: Gynecology

## 2014-06-12 DIAGNOSIS — N926 Irregular menstruation, unspecified: Secondary | ICD-10-CM

## 2014-06-12 NOTE — Patient Instructions (Signed)
Follow up for ultrasound as scheduled 

## 2014-06-12 NOTE — Progress Notes (Signed)
Stephanie Thomas 1965/10/23 [id number]        48 y.o.  G1P1 presents having started her period October 1 and bled on and off since then. Had several days of very heavy clotting which now has tapered down to daily like bleeding. Was evaluated 07/2013 at her annual exam for irregular menses where she was skipping menses and had a marginally elevated FSH at 37 with a normal TSH and prolactin. She was recommended for sonohysterogram but never followed up for this. She does note her menses seem to have been more regular over this past year up until this episode of prolonged bleeding.  No significant hot flushes night sweats fever hair skin or weight changes.  Past medical history,surgical history, problem list, medications, allergies, family history and social history were all reviewed and documented in the EPIC chart.  Directed ROS with pertinent positives and negatives documented in the history of present illness/assessment and plan.  Exam: Kim assistant General appearance:  Normal Abdomen soft nontender without masses guarding rebound organomegaly. Pelvic external BUS vagina with light menses flow. Cervix normal. Uterus anteverted normal size midline mobile nontender. Adnexa without masses or tenderness.  Assessment/Plan:  48 y.o. G1P1 with irregular prolonged menses. Prior history of skips with marginally elevated FSH. Recheck Garibaldi today. Schedule sonohysterogram to rule out intracavitary abnormalities and allow for endometrial sampling. Discussed possible options based on various findings with her. Will call me if she starts to bleed heavy again before the ultrasound and I will treat her with a short course of Megace but will old on this now as it seems like her bleeding is getting better.     [name], 9:41 AM 06/12/2014

## 2014-06-13 LAB — FOLLICLE STIMULATING HORMONE: FSH: 27.4 m[IU]/mL

## 2014-06-24 ENCOUNTER — Encounter: Payer: Self-pay | Admitting: Gynecology

## 2014-07-01 ENCOUNTER — Other Ambulatory Visit: Payer: Self-pay | Admitting: Gynecology

## 2014-07-01 DIAGNOSIS — N926 Irregular menstruation, unspecified: Secondary | ICD-10-CM

## 2014-07-17 ENCOUNTER — Other Ambulatory Visit: Payer: Self-pay | Admitting: Gynecology

## 2014-07-17 ENCOUNTER — Ambulatory Visit (INDEPENDENT_AMBULATORY_CARE_PROVIDER_SITE_OTHER): Payer: 59

## 2014-07-17 ENCOUNTER — Ambulatory Visit (INDEPENDENT_AMBULATORY_CARE_PROVIDER_SITE_OTHER): Payer: 59 | Admitting: Gynecology

## 2014-07-17 ENCOUNTER — Encounter: Payer: Self-pay | Admitting: Gynecology

## 2014-07-17 DIAGNOSIS — N831 Corpus luteum cyst of ovary, unspecified side: Secondary | ICD-10-CM

## 2014-07-17 DIAGNOSIS — N950 Postmenopausal bleeding: Secondary | ICD-10-CM

## 2014-07-17 DIAGNOSIS — N926 Irregular menstruation, unspecified: Secondary | ICD-10-CM

## 2014-07-17 DIAGNOSIS — N921 Excessive and frequent menstruation with irregular cycle: Secondary | ICD-10-CM

## 2014-07-17 NOTE — Patient Instructions (Addendum)
Follow up if irregular bleeding continues.  Office will call you with the biopsy results

## 2014-07-17 NOTE — Progress Notes (Signed)
Stephanie Thomas 1965/09/24 [id number]        48 y.o.  G1P1 Presents for sonohysterogram due to irregular bleeding. Recent FSH 27.  Past medical history,surgical history, problem list, medications, allergies, family history and social history were all reviewed and documented in the EPIC chart.  Directed ROS with pertinent positives and negatives documented in the history of present illness/assessment and plan.  Ultrasound shows uterus normal size and echotexture. Endometrial echo 6.9 mm. Right left ovaries with physiologic change. Cul-de-sac negative.  Sonohysterogram performed, sterile technique, easy catheter introduction, good distention with no clear abnormalities. Did have some prominence of the posterior endometrium that looked polypoidal but under real time injection it appears that this is endometrium being "fluffed" by the saline and not a true polyp. Endometrial sample taken. Patient tolerated well.  Assessment/Plan:  48 y.o. G1P1 with irregular bleeding. Patient will follow up for biopsy results. Will monitor her bleeding at present. If continues irregular various options reviewed to include intermittent progesterone, low-dose oral contraceptive regulation or Mirena IUD. Patient will call if irregular bleeding continues.     [name], 10:02 AM 07/17/2014

## 2014-08-13 ENCOUNTER — Ambulatory Visit (INDEPENDENT_AMBULATORY_CARE_PROVIDER_SITE_OTHER): Payer: 59 | Admitting: Gynecology

## 2014-08-13 ENCOUNTER — Encounter: Payer: Self-pay | Admitting: Gynecology

## 2014-08-13 VITALS — BP 118/74 | Ht 61.0 in | Wt 113.0 lb

## 2014-08-13 DIAGNOSIS — Z01419 Encounter for gynecological examination (general) (routine) without abnormal findings: Secondary | ICD-10-CM

## 2014-08-13 MED ORDER — ALPRAZOLAM 0.5 MG PO TABS: 0.5000 mg | ORAL_TABLET | Freq: Every evening | ORAL | Status: DC | PRN

## 2014-08-13 NOTE — Progress Notes (Signed)
Stephanie Thomas 1965-11-12 [id number]        48 y.o.  G1P1 for annual exam.  Several issues noted below.  Past medical history,surgical history, problem list, medications, allergies, family history and social history were all reviewed and documented as reviewed in the EPIC chart.  ROS:  Performed with pertinent positives and negatives included in the history, assessment and plan.   Additional significant findings :  none   Exam: [name] Vitals:   08/13/14 1137  BP: 118/74  Height: 5\' 1"  (1.549 m)  Weight: 113 lb (51.256 kg)   General appearance:  Normal affect, orientation and appearance. Skin: Grossly normal HEENT: Without gross lesions.  No cervical or supraclavicular adenopathy. Thyroid normal.  Lungs:  Clear without wheezing, rales or rhonchi Cardiac: RR, without RMG Abdominal:  Soft, nontender, without masses, guarding, rebound, organomegaly or hernia Breasts:  Examined lying and sitting without masses, retractions, discharge or axillary adenopathy. Pelvic:  Ext/BUS/vagina normal  Cervix normal  Uterus anteverted, normal size, shape and contour, midline and mobile nontender   Adnexa  Without masses or tenderness    Anus and perineum  Normal   Rectovaginal  Normal sphincter tone without palpated masses or tenderness.    Assessment/Plan:  48 y.o. G1P1 female for annual exam with irregular menses, vasectomy birth control..   1. Irregular menses.  Patient has had on and off bleeding over the past several months. Recently underwent negative sonohysterogram with negative endometrial biopsy. FSH was 27. Prior study showed a normal TSH and prolactin. Options for management reviewed to include observation, hormonal manipulation such as low-dose oral contraceptives, intermittent progesterone, Mirena IUD. At this point patient would prefer just observation. She'll call if her irregular bleeding continues. Risks of oral contraceptives to include thrombosis such as stroke heart  attack DVT discussed.  She is a never smoker with normal blood pressure and no issue with diabetes. 2. Mammography 02/2014. Continue with annual mammography. SBE monthly reviewed. 3. Pap smear/HPV negative 2014.  No history of abnormal Pap smears previously. Plan repeat at 3-5 year interval per current screening guidelines. 4. Colonoscopy 2014. Repeat at their recommended interval. 5. Health maintenance. No routine blood work done as she reports it recently done at her primary physician's office. Follow up if irregular bleeding continues otherwise annually.     [name], 12:05 PM 08/13/2014

## 2014-08-13 NOTE — Patient Instructions (Signed)
You may obtain a copy of any labs that were done today by logging onto MyChart as outlined in the instructions provided with your AVS (after visit summary). The office will not call with normal lab results but certainly if there are any significant abnormalities then we will contact you.   Health Maintenance, Female A healthy lifestyle and preventative care can promote health and wellness.  Maintain regular health, dental, and eye exams.  Eat a healthy diet. Foods like vegetables, fruits, whole grains, low-fat dairy products, and lean protein foods contain the nutrients you need without too many calories. Decrease your intake of foods high in solid fats, added sugars, and salt. Get information about a proper diet from your caregiver, if necessary.  Regular physical exercise is one of the most important things you can do for your health. Most adults should get at least 150 minutes of moderate-intensity exercise (any activity that increases your heart rate and causes you to sweat) each week. In addition, most adults need muscle-strengthening exercises on 2 or more days a week.   Maintain a healthy weight. The body mass index (BMI) is a screening tool to identify possible weight problems. It provides an estimate of body fat based on height and weight. Your caregiver can help determine your BMI, and can help you achieve or maintain a healthy weight. For adults 20 years and older:  A BMI below 18.5 is considered underweight.  A BMI of 18.5 to 24.9 is normal.  A BMI of 25 to 29.9 is considered overweight.  A BMI of 30 and above is considered obese.  Maintain normal blood lipids and cholesterol by exercising and minimizing your intake of saturated fat. Eat a balanced diet with plenty of fruits and vegetables. Blood tests for lipids and cholesterol should begin at age 61 and be repeated every 5 years. If your lipid or cholesterol levels are high, you are over 50, or you are a high risk for heart  disease, you may need your cholesterol levels checked more frequently.Ongoing high lipid and cholesterol levels should be treated with medicines if diet and exercise are not effective.  If you smoke, find out from your caregiver how to quit. If you do not use tobacco, do not start.  Lung cancer screening is recommended for adults aged 33 80 years who are at high risk for developing lung cancer because of a history of smoking. Yearly low-dose computed tomography (CT) is recommended for people who have at least a 30-pack-year history of smoking and are a current smoker or have quit within the past 15 years. A pack year of smoking is smoking an average of 1 pack of cigarettes a day for 1 year (for example: 1 pack a day for 30 years or 2 packs a day for 15 years). Yearly screening should continue until the smoker has stopped smoking for at least 15 years. Yearly screening should also be stopped for people who develop a health problem that would prevent them from having lung cancer treatment.  If you are pregnant, do not drink alcohol. If you are breastfeeding, be very cautious about drinking alcohol. If you are not pregnant and choose to drink alcohol, do not exceed 1 drink per day. One drink is considered to be 12 ounces (355 mL) of beer, 5 ounces (148 mL) of wine, or 1.5 ounces (44 mL) of liquor.  Avoid use of street drugs. Do not share needles with anyone. Ask for help if you need support or instructions about stopping  the use of drugs.  High blood pressure causes heart disease and increases the risk of stroke. Blood pressure should be checked at least every 1 to 2 years. Ongoing high blood pressure should be treated with medicines, if weight loss and exercise are not effective.  If you are 59 to 48 years old, ask your caregiver if you should take aspirin to prevent strokes.  Diabetes screening involves taking a blood sample to check your fasting blood sugar level. This should be done once every 3  years, after age 91, if you are within normal weight and without risk factors for diabetes. Testing should be considered at a younger age or be carried out more frequently if you are overweight and have at least 1 risk factor for diabetes.  Breast cancer screening is essential preventative care for women. You should practice "breast self-awareness." This means understanding the normal appearance and feel of your breasts and may include breast self-examination. Any changes detected, no matter how small, should be reported to a caregiver. Women in their 66s and 30s should have a clinical breast exam (CBE) by a caregiver as part of a regular health exam every 1 to 3 years. After age 101, women should have a CBE every year. Starting at age 100, women should consider having a mammogram (breast X-ray) every year. Women who have a family history of breast cancer should talk to their caregiver about genetic screening. Women at a high risk of breast cancer should talk to their caregiver about having an MRI and a mammogram every year.  Breast cancer gene (BRCA)-related cancer risk assessment is recommended for women who have family members with BRCA-related cancers. BRCA-related cancers include breast, ovarian, tubal, and peritoneal cancers. Having family members with these cancers may be associated with an increased risk for harmful changes (mutations) in the breast cancer genes BRCA1 and BRCA2. Results of the assessment will determine the need for genetic counseling and BRCA1 and BRCA2 testing.  The Pap test is a screening test for cervical cancer. Women should have a Pap test starting at age 57. Between ages 25 and 35, Pap tests should be repeated every 2 years. Beginning at age 37, you should have a Pap test every 3 years as long as the past 3 Pap tests have been normal. If you had a hysterectomy for a problem that was not cancer or a condition that could lead to cancer, then you no longer need Pap tests. If you are  between ages 50 and 76, and you have had normal Pap tests going back 10 years, you no longer need Pap tests. If you have had past treatment for cervical cancer or a condition that could lead to cancer, you need Pap tests and screening for cancer for at least 20 years after your treatment. If Pap tests have been discontinued, risk factors (such as a new sexual partner) need to be reassessed to determine if screening should be resumed. Some women have medical problems that increase the chance of getting cervical cancer. In these cases, your caregiver may recommend more frequent screening and Pap tests.  The human papillomavirus (HPV) test is an additional test that may be used for cervical cancer screening. The HPV test looks for the virus that can cause the cell changes on the cervix. The cells collected during the Pap test can be tested for HPV. The HPV test could be used to screen women aged 44 years and older, and should be used in women of any age  who have unclear Pap test results. After the age of 55, women should have HPV testing at the same frequency as a Pap test.  Colorectal cancer can be detected and often prevented. Most routine colorectal cancer screening begins at the age of 44 and continues through age 20. However, your caregiver may recommend screening at an earlier age if you have risk factors for colon cancer. On a yearly basis, your caregiver may provide home test kits to check for hidden blood in the stool. Use of a small camera at the end of a tube, to directly examine the colon (sigmoidoscopy or colonoscopy), can detect the earliest forms of colorectal cancer. Talk to your caregiver about this at age 86, when routine screening begins. Direct examination of the colon should be repeated every 5 to 10 years through age 13, unless early forms of pre-cancerous polyps or small growths are found.  Hepatitis C blood testing is recommended for all people born from [birth year] through 1965 and any  individual with known risks for hepatitis C.  Practice safe sex. Use condoms and avoid high-risk sexual practices to reduce the spread of sexually transmitted infections (STIs). Sexually active women aged 36 and younger should be checked for Chlamydia, which is a common sexually transmitted infection. Older women with new or multiple partners should also be tested for Chlamydia. Testing for other STIs is recommended if you are sexually active and at increased risk.  Osteoporosis is a disease in which the bones lose minerals and strength with aging. This can result in serious bone fractures. The risk of osteoporosis can be identified using a bone density scan. Women ages 20 and over and women at risk for fractures or osteoporosis should discuss screening with their caregivers. Ask your caregiver whether you should be taking a calcium supplement or vitamin D to reduce the rate of osteoporosis.  Menopause can be associated with physical symptoms and risks. Hormone replacement therapy is available to decrease symptoms and risks. You should talk to your caregiver about whether hormone replacement therapy is right for you.  Use sunscreen. Apply sunscreen liberally and repeatedly throughout the day. You should seek shade when your shadow is shorter than you. Protect yourself by wearing long sleeves, pants, a wide-brimmed hat, and sunglasses year round, whenever you are outdoors.  Notify your caregiver of new moles or changes in moles, especially if there is a change in shape or color. Also notify your caregiver if a mole is larger than the size of a pencil eraser.  Stay current with your immunizations. Document Released: 02/22/2011 Document Revised: 12/04/2012 Document Reviewed: 02/22/2011 [facility] Patient Information 2014 [employer].

## 2014-08-14 LAB — URINALYSIS W MICROSCOPIC + REFLEX CULTURE
BACTERIA UA: NONE SEEN
BILIRUBIN URINE: NEGATIVE
CRYSTALS: NONE SEEN
Casts: NONE SEEN
Glucose, UA: NEGATIVE mg/dL
Ketones, ur: NEGATIVE mg/dL
NITRITE: NEGATIVE
PROTEIN: NEGATIVE mg/dL
SQUAMOUS EPITHELIAL / LPF: NONE SEEN
Specific Gravity, Urine: 1.012 (ref 1.005–1.030)
Urobilinogen, UA: 0.2 mg/dL (ref 0.0–1.0)
pH: 6 (ref 5.0–8.0)

## 2014-08-16 LAB — URINE CULTURE

## 2014-08-22 ENCOUNTER — Other Ambulatory Visit: Payer: Self-pay | Admitting: Gynecology

## 2014-08-22 MED ORDER — CIPROFLOXACIN HCL 250 MG PO TABS: 250.0000 mg | ORAL_TABLET | Freq: Two times a day (BID) | ORAL | Status: DC

## 2014-08-27 ENCOUNTER — Other Ambulatory Visit: Payer: Self-pay | Admitting: Gynecology

## 2014-08-27 MED ORDER — NITROFURANTOIN MONOHYD MACRO 100 MG PO CAPS: 100.0000 mg | ORAL_CAPSULE | Freq: Two times a day (BID) | ORAL | Status: DC

## 2015-03-13 ENCOUNTER — Other Ambulatory Visit: Payer: Self-pay

## 2015-03-13 DIAGNOSIS — Z1231 Encounter for screening mammogram for malignant neoplasm of breast: Secondary | ICD-10-CM

## 2015-03-14 ENCOUNTER — Ambulatory Visit
Admission: RE | Admit: 2015-03-14 | Discharge: 2015-03-14 | Disposition: A | Discharge status: Home / Self Care | Payer: Commercial Managed Care - HMO | Source: Ambulatory Visit

## 2015-03-14 DIAGNOSIS — Z1231 Encounter for screening mammogram for malignant neoplasm of breast: Secondary | ICD-10-CM

## 2015-08-15 ENCOUNTER — Encounter: Payer: 59 | Admitting: Gynecology

## 2015-08-19 ENCOUNTER — Encounter: Payer: Self-pay | Admitting: Gynecology

## 2015-10-01 ENCOUNTER — Encounter: Payer: Self-pay | Admitting: Gynecology

## 2015-10-29 ENCOUNTER — Ambulatory Visit (INDEPENDENT_AMBULATORY_CARE_PROVIDER_SITE_OTHER): Payer: Commercial Managed Care - HMO | Admitting: Gynecology

## 2015-10-29 ENCOUNTER — Encounter: Payer: Self-pay | Admitting: Gynecology

## 2015-10-29 VITALS — BP 116/74 | Ht 60.0 in | Wt 119.0 lb

## 2015-10-29 DIAGNOSIS — Z01419 Encounter for gynecological examination (general) (routine) without abnormal findings: Secondary | ICD-10-CM | POA: Diagnosis not present

## 2015-10-29 DIAGNOSIS — N926 Irregular menstruation, unspecified: Secondary | ICD-10-CM | POA: Diagnosis not present

## 2015-10-29 MED ORDER — MEDROXYPROGESTERONE ACETATE 10 MG PO TABS: 10.0000 mg | ORAL_TABLET | Freq: Two times a day (BID) | ORAL | Status: DC

## 2015-10-29 NOTE — Progress Notes (Signed)
    Stephanie Thomas [birth year] [id number]        50 y.o.  G1P1  for annual exam.  Doing well. Some irregularity in her menses as noted below.  Past medical history,surgical history, problem list, medications, allergies, family history and social history were all reviewed and documented as reviewed in the EPIC chart.  ROS:  Performed with pertinent positives and negatives included in the history, assessment and plan.   Additional significant findings :  none   Exam: [name] Vitals:   10/29/15 1522  BP: 116/74  Height: 5' (1.524 m)  Weight: 119 lb (53.978 kg)   General appearance:  Normal affect, orientation and appearance. Skin: Grossly normal HEENT: Without gross lesions.  No cervical or supraclavicular adenopathy. Thyroid normal.  Lungs:  Clear without wheezing, rales or rhonchi Cardiac: RR, without RMG Abdominal:  Soft, nontender, without masses, guarding, rebound, organomegaly or hernia Breasts:  Examined lying and sitting without masses, retractions, discharge or axillary adenopathy. Pelvic:  Ext/BUS/vagina normal  Cervix normal  Uterus anteverted, normal size, shape and contour, midline and mobile nontender   Adnexa without masses or tenderness    Anus and perineum normal   Rectovaginal normal sphincter tone without palpated masses or tenderness.    Assessment/Plan:  50 y.o. G1P1 female for annual exam with irregular menses, vasectomy birth control.   1. Irregular menses. Patient continues to have skips in her menses up to several months. When she does have a bleed after skipping 2 months is very heavy. Negative sonohysterogram and biopsy with FSH 27 previously.  Discussed her perimenopausal status and options to include observation, low-dose oral contraceptive override for 1-2 years versus intermittent progesterone withdrawal it goes more than 6 weeks without menses to prevent heavy bleeding when she does have her period. Patient prefers the Provera  treatment intermittently and will go with 10 mg twice a day 5 days if she goes 6 weeks without menses. Prescription with refills provided. At this point her hot flushes and night sweats are not significant that she wants to consider HRT. Will call if prolonged or atypical bleeding or significant symptoms toward discussion. 2. Mammography 02/2015. Continue with annual mammography when due. SBE monthly reviewed. 3. Pap smear/HPV 07/2013 negative. No history of significant abnormal Pap smears previously. 4. Colonoscopy 2014. Repeat at their recommended interval. 5. Health maintenance. No routine blood work done as patient reports this recently done at her primary physician's office. Follow up 1 year, sooner as needed.   [name], 4:15 PM 10/29/2015

## 2015-10-29 NOTE — Patient Instructions (Signed)

## 2016-03-10 ENCOUNTER — Telehealth: Payer: Self-pay | Admitting: *Deleted

## 2016-03-10 NOTE — Telephone Encounter (Signed)
Pt took her 1st round of provera 10 mg twice a day 5 days if she goes 6 weeks without menses. Finished pills on 03/07/16, states no cycle yet, I told pt to wait 1 week and to call back if no cycle then. Pt understood and will call to follow up if needed.

## 2016-04-13 ENCOUNTER — Other Ambulatory Visit: Payer: Self-pay | Admitting: Gynecology

## 2016-04-13 DIAGNOSIS — Z1231 Encounter for screening mammogram for malignant neoplasm of breast: Secondary | ICD-10-CM

## 2016-04-21 ENCOUNTER — Ambulatory Visit: Payer: Commercial Managed Care - HMO

## 2016-04-22 ENCOUNTER — Ambulatory Visit
Admission: RE | Admit: 2016-04-22 | Discharge: 2016-04-22 | Disposition: A | Discharge status: Home / Self Care | Payer: Commercial Managed Care - HMO | Source: Ambulatory Visit | Attending: Gynecology | Admitting: Gynecology

## 2016-04-22 DIAGNOSIS — Z1231 Encounter for screening mammogram for malignant neoplasm of breast: Secondary | ICD-10-CM

## 2016-08-25 DIAGNOSIS — Z0000 Encounter for general adult medical examination without abnormal findings: Secondary | ICD-10-CM | POA: Diagnosis not present

## 2016-08-25 DIAGNOSIS — Z6824 Body mass index (BMI) 24.0-24.9, adult: Secondary | ICD-10-CM | POA: Diagnosis not present

## 2016-08-26 DIAGNOSIS — Z6824 Body mass index (BMI) 24.0-24.9, adult: Secondary | ICD-10-CM | POA: Diagnosis not present

## 2016-08-26 DIAGNOSIS — Z0000 Encounter for general adult medical examination without abnormal findings: Secondary | ICD-10-CM | POA: Diagnosis not present

## 2016-08-26 DIAGNOSIS — I10 Essential (primary) hypertension: Secondary | ICD-10-CM | POA: Diagnosis not present

## 2016-09-02 ENCOUNTER — Encounter: Payer: Self-pay | Admitting: Cardiovascular Disease

## 2016-09-28 ENCOUNTER — Ambulatory Visit: Payer: Commercial Managed Care - HMO | Admitting: Cardiovascular Disease

## 2016-09-30 ENCOUNTER — Encounter: Payer: Self-pay | Admitting: Cardiology

## 2016-10-05 ENCOUNTER — Ambulatory Visit (INDEPENDENT_AMBULATORY_CARE_PROVIDER_SITE_OTHER): Payer: Commercial Managed Care - HMO | Admitting: Cardiology

## 2016-10-05 ENCOUNTER — Encounter: Payer: Self-pay | Admitting: Cardiology

## 2016-10-05 ENCOUNTER — Encounter (INDEPENDENT_AMBULATORY_CARE_PROVIDER_SITE_OTHER): Payer: Self-pay

## 2016-10-05 VITALS — BP 116/72 | HR 86 | Ht 60.0 in | Wt 129.4 lb

## 2016-10-05 DIAGNOSIS — R0609 Other forms of dyspnea: Secondary | ICD-10-CM

## 2016-10-05 DIAGNOSIS — R002 Palpitations: Secondary | ICD-10-CM | POA: Diagnosis not present

## 2016-10-05 DIAGNOSIS — R9431 Abnormal electrocardiogram [ECG] [EKG]: Secondary | ICD-10-CM

## 2016-10-05 DIAGNOSIS — Z8249 Family history of ischemic heart disease and other diseases of the circulatory system: Secondary | ICD-10-CM

## 2016-10-05 NOTE — Patient Instructions (Addendum)
Medication Instructions:  The current medical regimen is effective;  continue present plan and medications.  Testing/Procedures: Your physician has requested that you have a myoview. For further information please visit [email or web address]. Please follow instruction sheet, as given.  Follow-Up: Further follow up will be based on the results of the above testing.  Thank you for choosing [facility]!!

## 2016-10-05 NOTE — Progress Notes (Signed)
Cardiology Office Note    Date:  10/05/2016   ID:  Lavasia Meine, DOB 06-Jul-1966, MRN [id number]  PCP:  Purvis Kilts, MD  Cardiologist:   Candee Furbish, MD   Chief Complaint  Patient presents with  . Establish Care    History of Present Illness:  Stephanie Thomas is a 51 y.o. female here for evaluation of possible coronary artery disease given her strong family history.  Sister CABG 24. Mother 55.   Her sister ended up not feeling right she stated, had a stress test, had cardiac catheterization and then ended up with bypass surgery.  She herself, has felt occasional palpitations over the past 20 years. She saw [place] once in 2010 for this evaluation.  When she has spells of palpitations, husband can hear with stethoscope. After having many of these palpitations, she will feel "so tired".   If she travels a few flights of stairs, she will feel shortness of breath. She does not feel chest pressure or pain, no arm radiation, no jaw pain. No syncope.  She tries to eat well. No smoking. No diabetes.   Past Medical History:  Diagnosis Date  . Allergy    SEASONAL  . GERD (gastroesophageal reflux disease)   . Hyperplastic colon polyp 03/30/2013  . Hypertension   . Sponge kidney     Past Surgical History:  Procedure Laterality Date  . COLONOSCOPY  2014   multiple   . LAPAROSCOPY  1995    Current Medications: Outpatient Medications Prior to Visit  Medication Sig Dispense Refill  . Cetirizine HCl (ZYRTEC PO) Take by mouth.    . diphenhydrAMINE (BENADRYL) 25 MG tablet Take 25 mg by mouth every 6 (six) hours as needed for itching or allergies.    Marland Kitchen lisinopril-hydrochlorothiazide (PRINZIDE,ZESTORETIC) 10-12.5 MG per tablet Take 1 tablet by mouth daily.    . medroxyPROGESTERone (PROVERA) 10 MG tablet Take 1 tablet (10 mg total) by mouth 2 (two) times daily. 10 tablet 4   No facility-administered medications prior to visit.      Allergies:   Flagyl [metronidazole hcl]; Sulfa  antibiotics; and Tetracyclines & related   Social History   Social History  . Marital status: Married    Spouse name: N/A  . Number of children: N/A  . Years of education: N/A   Social History Main Topics  . Smoking status: Never Smoker  . Smokeless tobacco: Never Used  . Alcohol use No  . Drug use: No  . Sexual activity: Yes    Partners: Male    Birth control/ protection: Other-see comments     Comment: vasectomy-1st intercourse 51 yo-5 partners   Other Topics Concern  . None   Social History Narrative  . None     Family History:  The patient's family history includes Colon cancer in her mother; Crohn's disease in her sister; Diabetes in her mother; Heart disease in her father and mother; Hypertension in her father, mother, and sister; Multiple sclerosis in her sister.   ROS:   Please see the history of present illness.    ROS All other systems reviewed and are negative.   PHYSICAL EXAM:   VS:  BP 116/72   Pulse 86   Ht 5' (1.524 m)   Wt 129 lb 6.4 oz (58.7 kg)   SpO2 98%   BMI 25.27 kg/m    GEN: Well nourished, well developed, in no acute distress  HEENT: normal  Neck: no JVD, carotid bruits, or masses Cardiac: RRR;  no murmurs, rubs, or gallops,no edema  Respiratory:  clear to auscultation bilaterally, normal work of breathing GI: soft, nontender, nondistended, + BS MS: no deformity or atrophy  Skin: warm and dry, no rash Neuro:  Alert and Oriented x 3, Strength and sensation are intact Psych: euthymic mood, full affect  Wt Readings from Last 3 Encounters:  10/05/16 129 lb 6.4 oz (58.7 kg)  10/29/15 119 lb (54 kg)  08/13/14 113 lb (51.3 kg)      Studies/Labs Reviewed:   EKG:  EKG is ordered today.  The ekg ordered today demonstrates 10/05/16-sinus rhythm, PAC, ST changes inferiorly and laterally, cannot exclude ischemia.  Recent Labs: No results found for requested labs within last 8760 hours.   Lipid Panel    Component Value Date/Time   CHOL 184  08/15/2013 1125   TRIG 148 08/15/2013 1125   HDL 81 08/15/2013 1125   CHOLHDL 2.3 08/15/2013 1125   VLDL 30 08/15/2013 1125   LDLCALC 73 08/15/2013 1125    Additional studies/ records that were reviewed today include:  EKG reviewed, prior office notes reviewed, lab work reviewed.    ASSESSMENT:    1. Dyspnea on exertion   2. Abnormal EKG   3. Family history of early CAD   93. Palpitations      PLAN:  In order of problems listed above:  Dyspnea on exertion/strong family history of coronary artery disease/abnormal EKG  - Her sister ended up having bypass surgery at age 65, her mother had bypass surgery at age 63 as well. She is worried about the possibility of herself having severe coronary artery disease. Her EKG is abnormal showing T-wave inversions inferiorly and laterally, could be repolarization or perhaps ischemia. She does have dyspnea on heavy exertion such as several stairs for instance. I think be wise to proceed with nuclear stress test, treadmill to further evaluate for possible ischemia.  - Continue with primary prevention efforts. Her LDL has been excellent, 73. Advocate diet, exercise.  Premature atrial contractions  - 1 was picked up on her EKG today. These are the etiology behind her palpitations area benign.  - Try not to drink too much caffeine, try to avoid Sudafed.     Medication Adjustments/Labs and Tests Ordered: Current medicines are reviewed at length with the patient today.  Concerns regarding medicines are outlined above.  Medication changes, Labs and Tests ordered today are listed in the Patient Instructions below. Patient Instructions  Medication Instructions:  The current medical regimen is effective;  continue present plan and medications.  Testing/Procedures: Your physician has requested that you have a myoview. For further information please visit [email or web address]. Please follow instruction sheet, as given.  Follow-Up: Further follow up  will be based on the results of the above testing.  Thank you for choosing [facility]!!        Signed, Candee Furbish, MD  10/05/2016 3:42 PM    [address], [place], [place]  [address] Phone: [phone]; Fax: [phone]

## 2016-10-12 ENCOUNTER — Telehealth (HOSPITAL_COMMUNITY): Payer: Self-pay | Admitting: *Deleted

## 2016-10-12 NOTE — Telephone Encounter (Signed)
Patient given detailed instructions per Myocardial Perfusion Study Information Sheet for the test on 10/14/16 at 0730. Patient notified to arrive 15 minutes early and that it is imperative to arrive on time for appointment to keep from having the test rescheduled.  If you need to cancel or reschedule your appointment, please call the office within 24 hours of your appointment. Failure to do so may result in a cancellation of your appointment, and a $50 no show fee. Patient verbalized understanding.Weber Monnier, Ranae Palms

## 2016-10-12 NOTE — Telephone Encounter (Signed)
Disregard previous telephone message. Entered in error.  Left message on voicemail per DPR in reference to upcoming appointment scheduled on 10/14/16 at 0730 with detailed instructions given per Myocardial Perfusion Study Information Sheet for the test. LM to arrive 15 minutes early, and that it is imperative to arrive on time for appointment to keep from having the test rescheduled. If you need to cancel or reschedule your appointment, please call the office within 24 hours of your appointment. Failure to do so may result in a cancellation of your appointment, and a $50 no show fee. Phone number given for call back for any questions.

## 2016-10-14 ENCOUNTER — Ambulatory Visit (HOSPITAL_COMMUNITY): Payer: Commercial Managed Care - HMO | Attending: Cardiology

## 2016-10-14 DIAGNOSIS — R9431 Abnormal electrocardiogram [ECG] [EKG]: Secondary | ICD-10-CM

## 2016-10-14 LAB — MYOCARDIAL PERFUSION IMAGING
CSEPEDS: 0 s
Estimated workload: 10.1 METS
Exercise duration (min): 9 min
LV dias vol: 61 mL (ref 46–106)
LVSYSVOL: 21 mL
MPHR: 170 {beats}/min
NUC STRESS TID: 1.01
Peak HR: 148 {beats}/min
Percent HR: 87 %
RATE: 0.29
Rest HR: 70 {beats}/min
SDS: 1
SRS: 2
SSS: 3

## 2016-10-14 MED ORDER — TECHNETIUM TC 99M TETROFOSMIN IV KIT
10.1000 | PACK | Freq: Once | INTRAVENOUS | Status: AC | PRN
  Administered 2016-10-14: 10.1 via INTRAVENOUS
  Filled 2016-10-14: qty 11

## 2016-10-14 MED ORDER — TECHNETIUM TC 99M TETROFOSMIN IV KIT
33.0000 | PACK | Freq: Once | INTRAVENOUS | Status: AC | PRN
  Administered 2016-10-14: 33 via INTRAVENOUS
  Filled 2016-10-14: qty 33

## 2016-10-29 ENCOUNTER — Encounter: Payer: Self-pay | Admitting: Gynecology

## 2016-10-29 ENCOUNTER — Ambulatory Visit (INDEPENDENT_AMBULATORY_CARE_PROVIDER_SITE_OTHER): Payer: Commercial Managed Care - HMO | Admitting: Gynecology

## 2016-10-29 VITALS — BP 120/74 | Ht 61.0 in | Wt 129.0 lb

## 2016-10-29 DIAGNOSIS — Z01419 Encounter for gynecological examination (general) (routine) without abnormal findings: Secondary | ICD-10-CM

## 2016-10-29 DIAGNOSIS — N951 Menopausal and female climacteric states: Secondary | ICD-10-CM

## 2016-10-29 NOTE — Patient Instructions (Signed)
Try the over-the-counter Estrovin to see if it does not help with hot flushes. If you want to rediscuss or start on hormone replacement therapy then call the office.

## 2016-10-29 NOTE — Progress Notes (Signed)
    Stephanie Thomas 1966/08/08 [id number]        51 y.o.  G1P1 for annual exam.  Patient notes last menstrual period January 2017. Took 1 course of Provera and may with no withdrawal bleed. Has had increasing hot flushes, night sweats and mood swings. No significant vaginal dryness. No bleeding or spotting at all since her LMP.  Past medical history,surgical history, problem list, medications, allergies, family history and social history were all reviewed and documented as reviewed in the EPIC chart.  ROS:  Performed with pertinent positives and negatives included in the history, assessment and plan.   Additional significant findings :  None   Exam: Caryn Bee assistant Vitals:   10/29/16 1521  BP: 120/74  Weight: 129 lb (58.5 kg)  Height: 5\' 1"  (1.549 m)   Body mass index is 24.37 kg/m.  General appearance:  Normal affect, orientation and appearance. Skin: Grossly normal HEENT: Without gross lesions.  No cervical or supraclavicular adenopathy. Thyroid normal.  Lungs:  Clear without wheezing, rales or rhonchi Cardiac: RR, without RMG Abdominal:  Soft, nontender, without masses, guarding, rebound, organomegaly or hernia Breasts:  Examined lying and sitting without masses, retractions, discharge or axillary adenopathy. Pelvic:  Ext, BUS, Vagina: Normal  Cervix: Normal  Uterus: Anteverted, normal size, shape and contour, midline and mobile nontender   Adnexa: Without masses or tenderness    Anus and perineum: Normal   Rectovaginal: Normal sphincter tone without palpated masses or tenderness.    Assessment/Plan:  51 y.o. G1P1 female for annual exam, vasectomy birth control menopause.   1. Menopause. Patient over 1 year without menses. Increasing hot flushes and night sweats. Options for management reviewed to include OTC products, pharmacologic nonhormonal such as Effexor and HRT. I reviewed the whole issue of HRT to include the WHI study and the most recent NAMS 2017 guidelines.  Risks to include thrombosis such as stroke heart attack DVT and breast cancer reviewed. Benefits as far as symptom relief and possible cardiovascular/bone health benefits started early also discussed. Various delivery response to include oral, transdermal and transvaginal discussed. Benefits of transdermal first pass effect reviewed. At this point patient does not want to start HRT but will try OTC products such as Estrovin. She will call if she changes her mind. If she does any vaginal bleeding she knows also to call. 2. Mammography 03/2016. Continue with annual mammography when due. SBE monthly reviewed. 3. Pap smear/HPV 07/2013 negative. No Pap smear done today. No history of significant abnormal Pap smears previously. Plan repeat Pap smear approaching 5 year interval per current screening guidelines. 4. Colonoscopy 2014. Repeat at their recommended interval. 5. Health maintenance. No routine lab work done as patient reports is done elsewhere. Follow up 1 year, follow up sooner if she wants to initiate HRT.  Additional time in excess of her routine gynecologic exam was spent in direct face to face counseling and coordination of care in regards to her menopausal symptoms with treatment options reviewed.    [name], 3:46 PM 10/29/2016

## 2016-11-03 ENCOUNTER — Telehealth: Payer: Self-pay | Admitting: *Deleted

## 2016-11-03 MED ORDER — ESTRADIOL 0.05 MG/24HR TD PTTW: 1.0000 | MEDICATED_PATCH | TRANSDERMAL | 11 refills | Status: DC

## 2016-11-03 MED ORDER — PROGESTERONE MICRONIZED 100 MG PO CAPS: 100.0000 mg | ORAL_CAPSULE | Freq: Every day | ORAL | 11 refills | Status: DC

## 2016-11-03 NOTE — Telephone Encounter (Signed)
I would recommend Vivelle 0.05 mg patch twice weekly #8 with 12 refills and Prometrium 100 mg oral at bedtime #30 with 12 refills. Call me if any issues after starting.

## 2016-11-03 NOTE — Telephone Encounter (Signed)
Pt called back and would like to proceed with HRT Rx low dose patch due to hot flashes. Please advise

## 2016-11-03 NOTE — Telephone Encounter (Signed)
Pt informed with the below note, Rx sent. 

## 2017-02-21 ENCOUNTER — Encounter: Payer: Self-pay | Admitting: Gynecology

## 2017-02-21 ENCOUNTER — Ambulatory Visit (INDEPENDENT_AMBULATORY_CARE_PROVIDER_SITE_OTHER): Payer: 59 | Admitting: Gynecology

## 2017-02-21 VITALS — BP 118/76

## 2017-02-21 DIAGNOSIS — N950 Postmenopausal bleeding: Secondary | ICD-10-CM | POA: Diagnosis not present

## 2017-02-21 DIAGNOSIS — B9689 Other specified bacterial agents as the cause of diseases classified elsewhere: Secondary | ICD-10-CM | POA: Diagnosis not present

## 2017-02-21 DIAGNOSIS — N760 Acute vaginitis: Secondary | ICD-10-CM | POA: Diagnosis not present

## 2017-02-21 LAB — WET PREP FOR TRICH, YEAST, CLUE
Trich, Wet Prep: NONE SEEN
Yeast Wet Prep HPF POC: NONE SEEN

## 2017-02-21 MED ORDER — CLINDAMYCIN PHOSPHATE 2 % VA CREA: 1.0000 | TOPICAL_CREAM | Freq: Every day | VAGINAL | 0 refills | Status: DC

## 2017-02-21 NOTE — Progress Notes (Signed)
    Stephanie Thomas 08/08/1966 [id number]        51 y.o.  G1P1 presents complaining of a menses letter part of June. Felt moliminal symptoms proceeding with breast tenderness and bloating. Used tampon which she forgot left in place for about 5 days and then removed. Since then she's had a vaginal odor with discharge. Prior menses over a year ago. She does note that she was having hot flushes and sweats to the extent that she was going to start on HRT but then these disappeared preceding the onset of her bleeding.  Past medical history,surgical history, problem list, medications, allergies, family history and social history were all reviewed and documented in the EPIC chart.  Directed ROS with pertinent positives and negatives documented in the history of present illness/assessment and plan.  Exam: Caryn Bee assistant Vitals:   02/21/17 1615  BP: 118/76   General appearance:  Normal Abdomen soft nontender without masses guarding rebound Pelvic external BUS vagina with frothy white discharge. Cervix normal. Uterus normal size midline mobile nontender. Adnexa without masses or tenderness.  Assessment/Plan:  51 y.o. G1P1 with:  1. Vaginal discharge and odor after leaving tampon in place for 5 days. Wet preps consistent with bacterial vaginosis. Patient is allergic to Flagyl. We'll treat with Cleocin vaginal cream nightly 7 nights. Follow up if symptoms persist, worsen or recur. 2. Episode of postmenopausal bleeding. Everything points towards escape ovulation with moliminal symptoms and regular menstrual like flow following. Also the resolution of her hot flushes. Will check baseline FSH today. Options for workup now include observation with follow up if any further bleeding to pursue a more involved evaluation or proceeding with sonohysterogram now. She did have a sonohysterogram 2-1/2 years ago for irregular bleeding which was negative. At this point the patient is comfortable observing and will  call if she does any further bleeding. Understands the issues of missed pathology.    [name], 4:33 PM 02/21/2017

## 2017-02-21 NOTE — Patient Instructions (Signed)
Use the prescribed cream intravaginally at bedtime for 7 nights. Follow up if symptoms persist or recur  Office will call you with the hormone level. Call if bleeding continues.

## 2017-02-21 NOTE — Addendum Note (Signed)
Addended by: Nelva Nay on: 02/21/2017 04:48 PM   Modules accepted: Orders

## 2017-02-22 ENCOUNTER — Encounter: Payer: Self-pay | Admitting: *Deleted

## 2017-02-22 LAB — FOLLICLE STIMULATING HORMONE: FSH: 72.4 m[IU]/mL

## 2017-02-24 NOTE — Telephone Encounter (Signed)
Urinalysis was not run. Elevated FSH means that she is into the menopause. She still could have some menses this early into it but the likelihood is that all of this will stop.

## 2017-03-27 DIAGNOSIS — J069 Acute upper respiratory infection, unspecified: Secondary | ICD-10-CM | POA: Diagnosis not present

## 2017-06-12 DIAGNOSIS — Z23 Encounter for immunization: Secondary | ICD-10-CM | POA: Diagnosis not present

## 2017-06-15 ENCOUNTER — Other Ambulatory Visit: Payer: Self-pay | Admitting: Gynecology

## 2017-06-15 DIAGNOSIS — Z1231 Encounter for screening mammogram for malignant neoplasm of breast: Secondary | ICD-10-CM

## 2017-07-06 ENCOUNTER — Ambulatory Visit: Payer: Commercial Managed Care - HMO

## 2017-07-18 ENCOUNTER — Ambulatory Visit
Admission: RE | Admit: 2017-07-18 | Discharge: 2017-07-18 | Disposition: A | Discharge status: Home / Self Care | Payer: Commercial Managed Care - HMO | Source: Ambulatory Visit | Attending: Gynecology | Admitting: Gynecology

## 2017-07-18 DIAGNOSIS — Z1231 Encounter for screening mammogram for malignant neoplasm of breast: Secondary | ICD-10-CM | POA: Diagnosis not present

## 2017-09-29 DIAGNOSIS — L250 Unspecified contact dermatitis due to cosmetics: Secondary | ICD-10-CM | POA: Diagnosis not present

## 2017-09-29 DIAGNOSIS — B351 Tinea unguium: Secondary | ICD-10-CM | POA: Diagnosis not present

## 2017-09-29 DIAGNOSIS — L603 Nail dystrophy: Secondary | ICD-10-CM | POA: Diagnosis not present

## 2017-11-02 DIAGNOSIS — Z0000 Encounter for general adult medical examination without abnormal findings: Secondary | ICD-10-CM | POA: Diagnosis not present

## 2017-11-02 DIAGNOSIS — E663 Overweight: Secondary | ICD-10-CM | POA: Diagnosis not present

## 2017-11-02 DIAGNOSIS — H1013 Acute atopic conjunctivitis, bilateral: Secondary | ICD-10-CM | POA: Diagnosis not present

## 2017-11-03 ENCOUNTER — Encounter: Payer: Commercial Managed Care - HMO | Admitting: Gynecology

## 2017-11-15 ENCOUNTER — Encounter: Payer: 59 | Admitting: Gynecology

## 2017-12-21 DIAGNOSIS — Z1389 Encounter for screening for other disorder: Secondary | ICD-10-CM | POA: Diagnosis not present

## 2017-12-21 DIAGNOSIS — I10 Essential (primary) hypertension: Secondary | ICD-10-CM | POA: Diagnosis not present

## 2017-12-21 DIAGNOSIS — Z0000 Encounter for general adult medical examination without abnormal findings: Secondary | ICD-10-CM | POA: Diagnosis not present

## 2017-12-28 ENCOUNTER — Ambulatory Visit (INDEPENDENT_AMBULATORY_CARE_PROVIDER_SITE_OTHER): Payer: 59 | Admitting: Gynecology

## 2017-12-28 ENCOUNTER — Encounter: Payer: Self-pay | Admitting: Gynecology

## 2017-12-28 VITALS — BP 120/78 | Ht 60.0 in | Wt 135.0 lb

## 2017-12-28 DIAGNOSIS — Z1151 Encounter for screening for human papillomavirus (HPV): Secondary | ICD-10-CM

## 2017-12-28 DIAGNOSIS — Z79890 Hormone replacement therapy (postmenopausal): Secondary | ICD-10-CM | POA: Diagnosis not present

## 2017-12-28 DIAGNOSIS — Z01411 Encounter for gynecological examination (general) (routine) with abnormal findings: Secondary | ICD-10-CM | POA: Diagnosis not present

## 2017-12-28 DIAGNOSIS — N951 Menopausal and female climacteric states: Secondary | ICD-10-CM

## 2017-12-28 MED ORDER — PROGESTERONE MICRONIZED 100 MG PO CAPS: 100.0000 mg | ORAL_CAPSULE | Freq: Every day | ORAL | 12 refills | Status: DC

## 2017-12-28 MED ORDER — ESTRADIOL 0.5 MG PO TABS: 0.5000 mg | ORAL_TABLET | Freq: Every day | ORAL | 12 refills | Status: DC

## 2017-12-28 NOTE — Addendum Note (Signed)
Addended by: Nelva Nay on: 12/28/2017 04:05 PM   Modules accepted: Orders

## 2017-12-28 NOTE — Progress Notes (Signed)
    Stephanie Thomas 12-22-1965 [id number]        52 y.o.  G1P1 for annual gynecologic exam.  Wants to discuss her menopausal symptoms.  Past medical history,surgical history, problem list, medications, allergies, family history and social history were all reviewed and documented as reviewed in the EPIC chart.  ROS:  Performed with pertinent positives and negatives included in the history, assessment and plan.   Additional significant findings : None   Exam: Caryn Bee assistant Vitals:   12/28/17 1509  BP: 120/78  Weight: 135 lb (61.2 kg)  Height: 5' (1.524 m)   Body mass index is 26.37 kg/m.  General appearance:  Normal affect, orientation and appearance. Skin: Grossly normal HEENT: Without gross lesions.  No cervical or supraclavicular adenopathy. Thyroid normal.  Lungs:  Clear without wheezing, rales or rhonchi Cardiac: RR, without RMG Abdominal:  Soft, nontender, without masses, guarding, rebound, organomegaly or hernia Breasts:  Examined lying and sitting without masses, retractions, discharge or axillary adenopathy. Pelvic:  Ext, BUS, Vagina: Normal  Cervix: Normal.  Pap smear/HPV  Uterus: Anteverted, normal size, shape and contour, midline and mobile nontender   Adnexa: Without masses or tenderness    Anus and perineum: Normal   Rectovaginal: Normal sphincter tone without palpated masses or tenderness.    Assessment/Plan:  52 y.o. G1P1 female for annual gynecologic exam, vasectomy birth control.   1. Perimenopausal.  Last bleeding episode in July.  Is having significant hot flushes and sweats.  We had discussed HRT previously and she was going to start on the estrogen patch with Prometrium.  She never did this because it was too expensive and she has continued to have her symptoms.  I again reviewed the whole issue of HRT with her to include risks versus benefits.  Thrombosis such as stroke heart attack DVT and breast cancer reviewed versus benefits to include symptom  relief as well as possible cardiovascular and bone health discussed.  Patient wants to go ahead and retry this but wants to go with the oral forms.  We will start with estradiol 0.5 mg and Prometrium 100 mg nightly.  Refill x1 year provided.  She will follow-up if she has any issues after starting this or that does not seem to control her symptoms for dosage adjustment.  She will call if she has any bleeding. 2. Mammography 06/2017.  Continue with annual mammography this fall.  Breast exam normal today. 3. Colonoscopy 2014.  Repeat at their recommended interval. 4. Pap smear/HPV 2014.  Pap smear/HPV today.  No history of significant abnormal Pap smears. 5. Health maintenance.  No routine lab work done as patient reports this done elsewhere.  Follow-up 1 year, sooner if any issues after starting HRT.   [name], 3:35 PM 12/28/2017

## 2017-12-28 NOTE — Patient Instructions (Signed)
Start on the hormone pills as we discussed.  Remember to take the progesterone pill at bedtime as it will make you sleepy.  Call me if you have any issues after starting this or any bleeding.

## 2018-01-02 LAB — PAP IG AND HPV HIGH-RISK: HPV DNA High Risk: NOT DETECTED

## 2018-03-13 DIAGNOSIS — R7309 Other abnormal glucose: Secondary | ICD-10-CM | POA: Diagnosis not present

## 2018-03-30 ENCOUNTER — Encounter: Payer: Self-pay | Admitting: Gastroenterology

## 2018-04-18 DIAGNOSIS — N342 Other urethritis: Secondary | ICD-10-CM | POA: Diagnosis not present

## 2018-04-18 DIAGNOSIS — Z1389 Encounter for screening for other disorder: Secondary | ICD-10-CM | POA: Diagnosis not present

## 2018-04-18 DIAGNOSIS — E663 Overweight: Secondary | ICD-10-CM | POA: Diagnosis not present

## 2018-04-18 DIAGNOSIS — Z6826 Body mass index (BMI) 26.0-26.9, adult: Secondary | ICD-10-CM | POA: Diagnosis not present

## 2018-06-12 ENCOUNTER — Other Ambulatory Visit: Payer: Self-pay | Admitting: Gynecology

## 2018-06-12 DIAGNOSIS — Z1231 Encounter for screening mammogram for malignant neoplasm of breast: Secondary | ICD-10-CM

## 2018-06-13 DIAGNOSIS — Z6825 Body mass index (BMI) 25.0-25.9, adult: Secondary | ICD-10-CM | POA: Diagnosis not present

## 2018-06-13 DIAGNOSIS — Q615 Medullary cystic kidney: Secondary | ICD-10-CM | POA: Diagnosis not present

## 2018-06-13 DIAGNOSIS — N342 Other urethritis: Secondary | ICD-10-CM | POA: Diagnosis not present

## 2018-07-24 ENCOUNTER — Ambulatory Visit
Admission: RE | Admit: 2018-07-24 | Discharge: 2018-07-24 | Disposition: A | Discharge status: Home / Self Care | Payer: 59 | Source: Ambulatory Visit | Attending: Gynecology | Admitting: Gynecology

## 2018-07-24 DIAGNOSIS — Z1231 Encounter for screening mammogram for malignant neoplasm of breast: Secondary | ICD-10-CM | POA: Diagnosis not present

## 2018-11-13 DIAGNOSIS — R002 Palpitations: Secondary | ICD-10-CM | POA: Diagnosis not present

## 2018-11-13 DIAGNOSIS — Z0001 Encounter for general adult medical examination with abnormal findings: Secondary | ICD-10-CM | POA: Diagnosis not present

## 2018-11-13 DIAGNOSIS — I10 Essential (primary) hypertension: Secondary | ICD-10-CM | POA: Diagnosis not present

## 2018-11-13 DIAGNOSIS — Z1389 Encounter for screening for other disorder: Secondary | ICD-10-CM | POA: Diagnosis not present

## 2018-11-13 DIAGNOSIS — Q615 Medullary cystic kidney: Secondary | ICD-10-CM | POA: Diagnosis not present

## 2019-01-01 ENCOUNTER — Other Ambulatory Visit: Payer: Self-pay

## 2019-01-02 ENCOUNTER — Encounter: Payer: Self-pay | Admitting: Gynecology

## 2019-01-02 ENCOUNTER — Ambulatory Visit (INDEPENDENT_AMBULATORY_CARE_PROVIDER_SITE_OTHER): Payer: 59 | Admitting: Gynecology

## 2019-01-02 VITALS — BP 124/80 | Ht 60.0 in | Wt 131.0 lb

## 2019-01-02 DIAGNOSIS — Z01419 Encounter for gynecological examination (general) (routine) without abnormal findings: Secondary | ICD-10-CM

## 2019-01-02 NOTE — Progress Notes (Signed)
    Stephanie Thomas 06-04-1966 [id number]        53 y.o.  G1P1 for annual gynecologic exam.  Doing well without gynecologic complaints.  Was to start on HRT last year due to menopausal symptoms but never did it and notes that her symptoms have resolved.  Past medical history,surgical history, problem list, medications, allergies, family history and social history were all reviewed and documented as reviewed in the EPIC chart.  ROS:  Performed with pertinent positives and negatives included in the history, assessment and plan.   Additional significant findings : None   Exam: Caryn Bee assistant Vitals:   01/02/19 0824  BP: 124/80  Weight: 131 lb (59.4 kg)  Height: 5' (1.524 m)   Body mass index is 25.58 kg/m.  General appearance:  Normal affect, orientation and appearance. Skin: Grossly normal HEENT: Without gross lesions.  No cervical or supraclavicular adenopathy. Thyroid normal.  Lungs:  Clear without wheezing, rales or rhonchi Cardiac: RR, without RMG Abdominal:  Soft, nontender, without masses, guarding, rebound, organomegaly or hernia Breasts:  Examined lying and sitting without masses, retractions, discharge or axillary adenopathy. Pelvic:  Ext, BUS, Vagina: Normal  Cervix: Normal  Uterus: Anteverted, normal size, shape and contour, midline and mobile nontender   Adnexa: Without masses or tenderness    Anus and perineum: Normal   Rectovaginal: Normal sphincter tone without palpated masses or tenderness.    Assessment/Plan:  53 y.o. G1P1 female for annual gynecologic exam.   1. Postmenopausal.  No significant menopausal symptoms or any vaginal bleeding.  Was to start on HRT last year but ended up never starting.  She is doing well now and not interested in any medications. 2. Pap smear/HPV 12/2017.  No Pap smear done today.  No history of significant abnormal Pap smears.  Plan repeat Pap smear/HPV at 5-year interval per current screening guidelines. 3. Mammography  07/2018.  Continue with annual mammography when due end of this year.  Breast exam normal today. 4. Colonoscopy 2014.  Repeat at their recommended interval. 5. DEXA never.  Will plan further into the menopause. 6. Health maintenance.  No routine lab work done as patient does this elsewhere.  Follow-up 1 year, sooner as needed.   [name], 8:45 AM 01/02/2019

## 2019-01-02 NOTE — Patient Instructions (Signed)
Follow-up in 1 year for annual exam, sooner if any issues. 

## 2019-03-15 ENCOUNTER — Ambulatory Visit: Payer: 59 | Admitting: *Deleted

## 2019-03-15 ENCOUNTER — Other Ambulatory Visit: Payer: Self-pay

## 2019-03-15 VITALS — Ht 61.0 in | Wt 132.0 lb

## 2019-03-15 DIAGNOSIS — Z800 Family history of malignant neoplasm of digestive organs: Secondary | ICD-10-CM

## 2019-03-15 MED ORDER — PLENVU 140 G PO SOLR: 1.0000 | ORAL | 0 refills | Status: DC

## 2019-03-15 NOTE — Progress Notes (Signed)
No egg or soy allergy known to patient  No issues with past sedation with any surgeries  or procedures, no intubation problems  No diet pills per patient No home 02 use per patient  No blood thinners per patient  Pt denies issues with constipation  No A fib or A flutter  EMMI video sent to pt's e mail   Pt verified name, DOB, address and insurance during PV today. Pt mailed instruction packet to included paper to complete and mail back to [facility] with addressed and stamped envelope, Emmi video, copy of consent form to read and not return, and instructions. Plenvu coupon mailed in packet. PV completed over the phone. Pt encouraged to call with questions or issues   Pt is aware that care partner will wait in the car during procedure; if they feel like they will be too hot to wait in the car; they may wait in the lobby.  We want them to wear a mask (we do not have any that we can provide them), practice social distancing, and we will check their temperatures when they get here.  I did remind patient that their care partner needs to stay in the parking lot the entire time. Pt will wear mask into building.

## 2019-03-22 ENCOUNTER — Telehealth: Payer: Self-pay | Admitting: Gastroenterology

## 2019-03-22 MED ORDER — SUPREP BOWEL PREP KIT 17.5-3.13-1.6 GM/177ML PO SOLN: 1.0000 | Freq: Once | ORAL | 0 refills | Status: AC

## 2019-03-22 NOTE — Telephone Encounter (Signed)
Patient called would like to know if she can the same prep that she had at her last procedure or get a cheaper prep

## 2019-03-22 NOTE — Telephone Encounter (Signed)
Pt states her prep is $50 with the Plenvu coupon, $125 without-  She is asking for a cheaper prep-  Suprep is $50 as well per [place] she would rather not pay that- will do a Plenvu sample - husband to [place] up 3rd floor receptionist with a mask - she is refusing Golytely as well  Lot [id number] exp 11/2019  As directed in prep instructions

## 2019-03-28 ENCOUNTER — Telehealth: Payer: Self-pay | Admitting: Gastroenterology

## 2019-03-28 NOTE — Telephone Encounter (Signed)
Spoke with patient regarding Covid-19 screening questions. Covid-19 Screening Questions:  Do you now or have you had a fever in the last 14 days? no  Do you have any respiratory symptoms of shortness of breath or cough now or in the last 14 days? no  Do you have any family members or close contacts with diagnosed or suspected Covid-19 in the past 14 days? no  Have you been tested for Covid-19 and found to be positive?  Yes in April--Negative  Pt made aware of that care partner may wait in the car or come up to the lobby during the procedure but will need to provide their own mask.

## 2019-03-29 ENCOUNTER — Ambulatory Visit (AMBULATORY_SURGERY_CENTER): Payer: 59 | Admitting: Gastroenterology

## 2019-03-29 ENCOUNTER — Encounter: Payer: Self-pay | Admitting: Gastroenterology

## 2019-03-29 ENCOUNTER — Other Ambulatory Visit: Payer: Self-pay

## 2019-03-29 VITALS — BP 112/61 | HR 60 | Temp 97.8°F | Resp 19 | Ht 61.0 in | Wt 132.0 lb

## 2019-03-29 DIAGNOSIS — D122 Benign neoplasm of ascending colon: Secondary | ICD-10-CM

## 2019-03-29 DIAGNOSIS — Z800 Family history of malignant neoplasm of digestive organs: Secondary | ICD-10-CM

## 2019-03-29 DIAGNOSIS — Z1211 Encounter for screening for malignant neoplasm of colon: Secondary | ICD-10-CM

## 2019-03-29 MED ORDER — SODIUM CHLORIDE 0.9 % IV SOLN: 500.0000 mL | Freq: Once | INTRAVENOUS | Status: DC

## 2019-03-29 NOTE — Progress Notes (Signed)
Called to room to assist during endoscopic procedure.  Patient ID and intended procedure confirmed with present staff. Received instructions for my participation in the procedure from the performing physician.  

## 2019-03-29 NOTE — Patient Instructions (Signed)
Impression/Recommendations:  Polyp handout given to patient.  Resume previous diet. Continue present medications. Await pathology results.  Repeat colonoscopy in 5 years for surveillance.  YOU HAD AN ENDOSCOPIC PROCEDURE TODAY AT [facility]:   Refer to the procedure report that was given to you for any specific questions about what was found during the examination.  If the procedure report does not answer your questions, please call your gastroenterologist to clarify.  If you requested that your care partner not be given the details of your procedure findings, then the procedure report has been included in a sealed envelope for you to review at your convenience later.  YOU SHOULD EXPECT: Some feelings of bloating in the abdomen. Passage of more gas than usual.  Walking can help get rid of the air that was put into your GI tract during the procedure and reduce the bloating. If you had a lower endoscopy (such as a colonoscopy or flexible sigmoidoscopy) you may notice spotting of blood in your stool or on the toilet paper. If you underwent a bowel prep for your procedure, you may not have a normal bowel movement for a few days.  Please Note:  You might notice some irritation and congestion in your nose or some drainage.  This is from the oxygen used during your procedure.  There is no need for concern and it should clear up in a day or so.  SYMPTOMS TO REPORT IMMEDIATELY:   Following lower endoscopy (colonoscopy or flexible sigmoidoscopy):  Excessive amounts of blood in the stool  Significant tenderness or worsening of abdominal pains  Swelling of the abdomen that is new, acute  Fever of 100F or higher For urgent or emergent issues, a gastroenterologist can be reached at any hour by calling [phone].   DIET:  We do recommend a small meal at first, but then you may proceed to your regular diet.  Drink plenty of fluids but you should avoid alcoholic beverages for 24  hours.  ACTIVITY:  You should plan to take it easy for the rest of today and you should NOT DRIVE or use heavy machinery until tomorrow (because of the sedation medicines used during the test).    FOLLOW UP: Our staff will call the number listed on your records 48-72 hours following your procedure to check on you and address any questions or concerns that you may have regarding the information given to you following your procedure. If we do not reach you, we will leave a message.  We will attempt to reach you two times.  During this call, we will ask if you have developed any symptoms of COVID 19. If you develop any symptoms (ie: fever, flu-like symptoms, shortness of breath, cough etc.) before then, please call [phone].  If you test positive for Covid 19 in the 2 weeks post procedure, please call and report this information to [place].    If any biopsies were taken you will be contacted by phone or by letter within the next 1-3 weeks.  Please call us at [phone] if you have not heard about the biopsies in 3 weeks.    SIGNATURES/CONFIDENTIALITY: You and/or your care partner have signed paperwork which will be entered into your electronic medical record.  These signatures attest to the fact that that the information above on your After Visit Summary has been reviewed and is understood.  Full responsibility of the confidentiality of this discharge information lies with you and/or your care-partner.

## 2019-03-29 NOTE — Progress Notes (Signed)
Abdomen soft.  Pt. Has not passed gas.  Pt. Comfortable.  Reviewed with pt. Measures to expel gas should she feel any bloating at home.

## 2019-03-29 NOTE — Progress Notes (Signed)
To PACU, VSS. Report to [email or web address] 

## 2019-03-29 NOTE — Op Note (Signed)
[facility] Patient Name: Stephanie Thomas Procedure Date: 03/29/2019 8:06 AM MRN: [id number] Endoscopist: Mallie Mussel L. Loletha Carrow , MD Age: 53 Referring MD:  Date of Birth: 12-12-1965 Gender: Female Account #: [id number] Procedure:                Colonoscopy Indications:              Screening in patient at increased risk: Colorectal                            cancer in mother before age 30 Medicines:                Monitored Anesthesia Care Procedure:                Pre-Anesthesia Assessment:                           - Prior to the procedure, a History and Physical                            was performed, and patient medications and                            allergies were reviewed. The patient's tolerance of                            previous anesthesia was also reviewed. The risks                            and benefits of the procedure and the sedation                            options and risks were discussed with the patient.                            All questions were answered, and informed consent                            was obtained. Prior Anticoagulants: The patient has                            taken no previous anticoagulant or antiplatelet                            agents. ASA Grade Assessment: II - A patient with                            mild systemic disease. After reviewing the risks                            and benefits, the patient was deemed in                            satisfactory condition to undergo the procedure.  After obtaining informed consent, the colonoscope                            was passed under direct vision. Throughout the                            procedure, the patient's blood pressure, pulse, and                            oxygen saturations were monitored continuously. The                            Colonoscope was introduced through the anus and                            advanced to the the cecum,  identified by                            appendiceal orifice and ileocecal valve. The                            colonoscopy was performed without difficulty. The                            patient tolerated the procedure well. The quality                            of the bowel preparation was excellent. The                            ileocecal valve, appendiceal orifice, and rectum                            were photographed. Scope In: 8:15:49 AM Scope Out: 8:29:50 AM Scope Withdrawal Time: 0 hours 9 minutes 31 seconds  Total Procedure Duration: 0 hours 14 minutes 1 second  Findings:                 The perianal and digital rectal examinations were                            normal.                           A 6 mm polyp was found in the ascending colon. The                            polyp was sessile. The polyp was removed with a                            cold snare. Resection and retrieval were complete.                           The exam was otherwise without abnormality on  direct and retroflexion views. Complications:            No immediate complications. Estimated Blood Loss:     Estimated blood loss was minimal. Impression:               - One 6 mm polyp in the ascending colon, removed                            with a cold snare. Resected and retrieved.                           - The examination was otherwise normal on direct                            and retroflexion views. Recommendation:           - Patient has a contact number available for                            emergencies. The signs and symptoms of potential                            delayed complications were discussed with the                            patient. Return to normal activities tomorrow.                            Written discharge instructions were provided to the                            patient.                           - Resume previous diet.                            - Continue present medications.                           - Await pathology results.                           - Repeat colonoscopy in 5 years for surveillance                            and family history. Carrington Olazabal L. Loletha Carrow, MD 03/29/2019 8:36:25 AM This report has been signed electronically.

## 2019-03-29 NOTE — Progress Notes (Signed)
Pt's states no medical or surgical changes since previsit or office visit.  Stephanie Thomas took temp and Courtney Washington took vitals. 

## 2019-04-02 ENCOUNTER — Telehealth: Payer: Self-pay | Admitting: *Deleted

## 2019-04-02 NOTE — Telephone Encounter (Signed)
  Follow up Call-  Call back number 03/29/2019  Post procedure Call Back phone  # [phone]  Permission to leave phone message Yes  Some recent data might be hidden     Patient questions:  Do you have a fever, pain , or abdominal swelling? No. Pain Score  0 *  Have you tolerated food without any problems? Yes.    Have you been able to return to your normal activities? Yes.    Do you have any questions about your discharge instructions: Diet   No. Medications  No. Follow up visit  No.  Do you have questions or concerns about your Care? No.  Actions: * If pain score is 4 or above: No action needed, pain <4.  1. Have you developed a fever since your procedure? no  2.   Have you had an respiratory symptoms (SOB or cough) since your procedure? no  3.   Have you tested positive for COVID 19 since your procedure no  4.   Have you had any family members/close contacts diagnosed with the COVID 19 since your procedure?  no   If yes to any of these questions please route to Joylene John, RN and Alphonsa Gin, Therapist, sports.

## 2019-04-03 ENCOUNTER — Encounter: Payer: Self-pay | Admitting: Gastroenterology

## 2019-05-16 ENCOUNTER — Encounter: Payer: Self-pay | Admitting: Gynecology

## 2020-03-19 ENCOUNTER — Other Ambulatory Visit: Payer: Self-pay | Admitting: Family Medicine

## 2020-03-19 DIAGNOSIS — Z1231 Encounter for screening mammogram for malignant neoplasm of breast: Secondary | ICD-10-CM

## 2020-04-02 ENCOUNTER — Other Ambulatory Visit: Payer: Self-pay

## 2020-04-02 ENCOUNTER — Ambulatory Visit
Admission: RE | Admit: 2020-04-02 | Discharge: 2020-04-02 | Disposition: A | Discharge status: Home / Self Care | Payer: 59 | Source: Ambulatory Visit | Attending: Family Medicine | Admitting: Family Medicine

## 2020-04-02 DIAGNOSIS — Z1231 Encounter for screening mammogram for malignant neoplasm of breast: Secondary | ICD-10-CM

## 2020-08-28 ENCOUNTER — Encounter: Payer: Self-pay | Admitting: *Deleted

## 2020-08-28 ENCOUNTER — Other Ambulatory Visit: Payer: Self-pay

## 2020-08-28 ENCOUNTER — Encounter: Payer: Self-pay | Admitting: Cardiology

## 2020-08-28 ENCOUNTER — Ambulatory Visit: Payer: 59 | Admitting: Cardiology

## 2020-08-28 ENCOUNTER — Ambulatory Visit (INDEPENDENT_AMBULATORY_CARE_PROVIDER_SITE_OTHER): Payer: 59

## 2020-08-28 VITALS — BP 120/90 | HR 79 | Ht 61.0 in | Wt 139.2 lb

## 2020-08-28 DIAGNOSIS — Z8249 Family history of ischemic heart disease and other diseases of the circulatory system: Secondary | ICD-10-CM

## 2020-08-28 DIAGNOSIS — R002 Palpitations: Secondary | ICD-10-CM

## 2020-08-28 DIAGNOSIS — R072 Precordial pain: Secondary | ICD-10-CM

## 2020-08-28 MED ORDER — LOSARTAN POTASSIUM 25 MG PO TABS
25.0000 mg | ORAL_TABLET | Freq: Every day | ORAL | 3 refills | Status: DC
Start: 2020-08-28 — End: 2021-08-05

## 2020-08-28 MED ORDER — METOPROLOL TARTRATE 100 MG PO TABS
100.0000 mg | ORAL_TABLET | Freq: Once | ORAL | 0 refills | Status: DC
Start: 2020-08-28 — End: 2020-09-29

## 2020-08-28 NOTE — Progress Notes (Signed)
Patient ID: Stephanie Thomas, female   DOB: 18-Feb-1966, 55 y.o.   MRN: [id number] Patient enrolled for [employer] to ship a 14 day ZIO XT long term holter monitor to her home.

## 2020-08-28 NOTE — Patient Instructions (Signed)
Medication Instructions:  Please start Losartan 25 mg once a day. Continue all other medications as listed.  *If you need a refill on your cardiac medications before your next appointment, please call your pharmacy*  You have been referred to our Hypertension Clinic here at [facility] will need to be seen in 1 month.  Testing/Procedures: Bryn Gulling- Long Term Monitor Instructions   Your physician has requested you wear your ZIO patch monitor 14 days.   This is a single patch monitor.  [employer] supplies one patch monitor per enrollment.  Additional stickers are not available.   Please do not apply patch if you will be having a Nuclear Stress Test, Echocardiogram, Cardiac CT, MRI, or Chest Xray during the time frame you would be wearing the monitor. The patch cannot be worn during these tests.  You cannot remove and re-apply the ZIO XT patch monitor.   Your ZIO patch monitor will be sent USPS Priority mail from [facility] directly to your home address. The monitor may also be mailed to a PO BOX if home delivery is not available.   It may take 3-5 days to receive your monitor after you have been enrolled.   Once you have received you monitor, please review enclosed instructions.  Your monitor has already been registered assigning a specific monitor serial # to you.   Applying the monitor   Shave hair from upper left chest.   Hold abrader disc by orange tab.  Rub abrader in 40 strokes over left upper chest as indicated in your monitor instructions.   Clean area with 4 enclosed alcohol pads .  Use all pads to assure are is cleaned thoroughly.  Let dry.   Apply patch as indicated in monitor instructions.  Patch will be place under collarbone on left side of chest with arrow pointing upward.   Rub patch adhesive wings for 2 minutes.Remove white label marked "1".  Remove white label marked "2".  Rub patch adhesive wings for 2 additional minutes.   While looking in a mirror, press and  release button in center of patch.  A small green light will flash 3-4 times .  This will be your only indicator the monitor has been turned on.     Do not shower for the first 24 hours.  You may shower after the first 24 hours.   Press button if you feel a symptom. You will hear a small click.  Record Date, Time and Symptom in the Patient Log Book.   When you are ready to remove patch, follow instructions on last 2 pages of Patient Log Book.  Stick patch monitor onto last page of Patient Log Book.   Place Patient Log Book in Tobias box.  Use locking tab on box and tape box closed securely.  The [employer] has [employer] on it.  Please place in mailbox as soon as possible.  Your physician should have your test results approximately 7 days after the monitor has been mailed back to [facility].   Call [place] at [phone] if you have questions regarding your ZIO XT patch monitor.  Call them immediately if you see an orange light blinking on your monitor.   If your monitor falls off in less than 4 days contact our Monitor department at [phone].  If your monitor becomes loose or falls off after 4 days call [employer] at [phone] for suggestions on securing your monitor.   Your cardiac CT  will be scheduled at:   [facility] [address] [place], [place] [address] [phone]  Please arrive at the [facility] main entrance of [facility] 30 minutes prior to test start time. Proceed to the [facility] Radiology Department (first floor) to check-in and test prep.  Please follow these instructions carefully (unless otherwise directed):  On the Night Before the Test: . Be sure to Drink plenty of water. . Do not consume any caffeinated/decaffeinated beverages or chocolate 12 hours prior to your test. . Do not take any antihistamines 12 hours prior to your test.  On the Day of the Test: . Drink plenty of water. Do not drink  any water within one hour of the test. . Do not eat any food 4 hours prior to the test. . You may take your regular medications prior to the test.  . Take metoprolol (Lopressor) two hours prior to test. . HOLD Furosemide/Hydrochlorothiazide morning of the test. . FEMALES- please wear underwire-free bra if available  After the Test: . Drink plenty of water. . After receiving IV contrast, you may experience a mild flushed feeling. This is normal. . On occasion, you may experience a mild rash up to 24 hours after the test. This is not dangerous. If this occurs, you can take Benadryl 25 mg and increase your fluid intake. . If you experience trouble breathing, this can be serious. If it is severe call 911 IMMEDIATELY. If it is mild, please call our office.  Once we have confirmed authorization from your insurance company, we will call you to set up a date and time for your test. Based on how quickly your insurance processes prior authorizations requests, please allow up to 4 weeks to be contacted for scheduling your Cardiac CT appointment. Be advised that routine Cardiac CT appointments could be scheduled as many as 8 weeks after your provider has ordered it.  For non-scheduling related questions, please contact the cardiac imaging nurse navigator should you have any questions/concerns: Marchia Bond, Cardiac Imaging Nurse Navigator Burley Saver, Interim Cardiac Imaging Nurse [facility] Direct Office Dial: [phone]   For scheduling needs, including cancellations and rescheduling, please call [place], [phone].  Follow-Up: At [facility], you and your health needs are our priority.  As part of our continuing mission to provide you with exceptional heart care, we have created designated Provider Care Teams.  These Care Teams include your primary Cardiologist (physician) and Advanced Practice Providers (APPs -  Physician Assistants and Nurse  Practitioners) who all work together to provide you with the care you need, when you need it.  We recommend signing up for the patient portal called "MyChart".  Sign up information is provided on this After Visit Summary.  MyChart is used to connect with patients for Virtual Visits (Telemedicine).  Patients are able to view lab/test results, encounter notes, upcoming appointments, etc.  Non-urgent messages can be sent to your provider as well.   To learn more about what you can do with MyChart, go to [email or web address].    Your next appointment:   3 month(s)  The format for your next appointment:   In Person  Provider:   Candee Furbish, MD   Thank you for choosing [facility]!!

## 2020-08-28 NOTE — Progress Notes (Signed)
Cardiology Office Note:    Date:  08/28/2020   ID:  Stephanie Thomas, DOB [birth year], MRN [id number]  PCP:  Sharilyn Sites, MD  [place] Cardiologist:  No primary care provider on file.  [facility] Electrophysiologist:  None   Referring MD: Sharilyn Sites, MD    History of Present Illness:    Stephanie Thomas is a 55 y.o. female here for strong family history of coronary artery disease evaluation at the request of Dr. Hilma Favors.  Prior visit was approximately 4 years ago.  Sister had CABG in her 39, mother also at 1.  Palpitations- occasional.  Saw [place] previously in 2010 for this evaluation. Feels tired with these palpitations.  Nuclear stress test was performed in 2018 which was low risk, no ischemia.  LDL 73 in 2014.  99 HDL 70 triglycerides 221 total cholesterol 213.  Hemoglobin 13.2 creatinine 0.7 potassium 4.4 ALT 11 TSH 2.04 outside labs reviewed.  HTN been challenging to control. Amlodipine ankle swelling.   Racing heart at night. Chest pain at times. Different than gas pain.  See below for details.  Past Medical History:  Diagnosis Date  . Allergy    SEASONAL  . Family hx of colon cancer    mother- late 65's, early 37's  . GERD (gastroesophageal reflux disease)   . Hyperplastic colon polyp 03/30/2013  . Hypertension   . Sponge kidney     Past Surgical History:  Procedure Laterality Date  . COLONOSCOPY  2014   multiple   . LAPAROSCOPY  1995  . POLYPECTOMY     HPP     Current Medications: Current Meds  Medication Sig  . amLODipine (NORVASC) 10 MG tablet Take 10 mg by mouth daily. Per patient taking 1/2 tablet  . escitalopram (LEXAPRO) 20 MG tablet Take 20 mg by mouth daily.  . hydrochlorothiazide (HYDRODIURIL) 25 MG tablet Take 25 mg by mouth daily.  Marland Kitchen losartan (COZAAR) 25 MG tablet Take 1 tablet (25 mg total) by mouth daily.  . metoprolol tartrate (LOPRESSOR) 100 MG tablet Take 1 tablet (100 mg total) by mouth once for 1 dose. Take 1 tablet 2 hr before  coronary CT  . [DISCONTINUED] Multiple Vitamins-Minerals (ZINC PO) Take by mouth.   Current Facility-Administered Medications for the 08/28/20 encounter (Office Visit) with Jerline Pain, MD  Medication  . 0.9 %  sodium chloride infusion     Allergies:   Flagyl [metronidazole hcl], Sulfa antibiotics, and Tetracyclines & related   Social History   Socioeconomic History  . Marital status: Married    Spouse name: Not on file  . Number of children: Not on file  . Years of education: Not on file  . Highest education level: Not on file  Occupational History  . Not on file  Tobacco Use  . Smoking status: Never Smoker  . Smokeless tobacco: Never Used  Vaping Use  . Vaping Use: Never used  Substance and Sexual Activity  . Alcohol use: Yes    Alcohol/week: 0.0 standard drinks    Comment: Rare  . Drug use: No  . Sexual activity: Yes    Partners: Male    Birth control/protection: Other-see comments    Comment: vasectomy-1st intercourse 50 yo-5 partners  Other Topics Concern  . Not on file  Social History Narrative  . Not on file   Social Determinants of Health   Financial Resource Strain: Not on file  Food Insecurity: Not on file  Transportation Needs: Not on file  Physical Activity:  Not on file  Stress: Not on file  Social Connections: Not on file     Family History: The patient's family history includes Colon cancer in her mother; Crohn's disease in her sister; Diabetes in her mother; Heart disease in her father, mother, and sister; Hypertension in her father, mother, and sister; Multiple sclerosis in her sister. There is no history of Kidney disease, Liver disease, Colon polyps, Esophageal cancer, Rectal cancer, or Stomach cancer.  ROS:   Please see the history of present illness.     All other systems reviewed and are negative.  EKGs/Labs/Other Studies Reviewed:    The following studies were reviewed today: Nuclear stress test 2018 low risk  EKG:  EKG is  ordered  today.  The ekg ordered today demonstrates sinus rhythm 79 with subtle T wave inversion noted in aVF  Recent Labs: No results found for requested labs within last 8760 hours.  Recent Lipid Panel    Component Value Date/Time   CHOL 184 08/15/2013 1125   TRIG 148 08/15/2013 1125   HDL 81 08/15/2013 1125   CHOLHDL 2.3 08/15/2013 1125   VLDL 30 08/15/2013 1125   LDLCALC 73 08/15/2013 1125     Risk Assessment/Calculations:       Physical Exam:    VS:  BP 120/90   Pulse 79   Ht '5\' 1"'  (1.549 m)   Wt 139 lb 3.2 oz (63.1 kg)   LMP 07/04/2017   SpO2 97%   BMI 26.30 kg/m     Wt Readings from Last 3 Encounters:  08/28/20 139 lb 3.2 oz (63.1 kg)  03/29/19 132 lb (59.9 kg)  03/15/19 132 lb (59.9 kg)     GEN:  Well nourished, well developed in no acute distress HEENT: Normal NECK: No JVD; No carotid bruits LYMPHATICS: No lymphadenopathy CARDIAC: RRR, no murmurs, rubs, gallops RESPIRATORY:  Clear to auscultation without rales, wheezing or rhonchi  ABDOMEN: Soft, non-tender, non-distended MUSCULOSKELETAL:  No edema; No deformity  SKIN: Warm and dry NEUROLOGIC:  Alert and oriented x 3 PSYCHIATRIC:  Normal affect   ASSESSMENT:    1. Palpitations   2. Family history of early CAD   3. Precordial pain    PLAN:    In order of problems listed above:  Family history of coronary artery disease - Nuclear stress test 2018 was low risk. - Both mother and sister had bypass at age 65.  She has had previous dyspnea on exertion.  Stairs for instance. - Primary prevention strategies.  LDL in the past has been 73.  Premature atrial contractions - Prior EKG showed 1 PAC.  She does feel palpitations occasionally at night.  Her husband could hear this on stethoscope.  Conservative measures  HTN  - Continue to monitor blood pressures at home.  She does this via Bluetooth.  Her diastolics are usually in the 90s.  Her systolics can be in the 161W to 140s, stage I to stage II.  She has had  some difficulties with medications.  Higher dose amlodipine has caused ankle swelling.  She is doing better on the 5 mg.  Continue.  HCTZ 25 mg.  Has been on this for quite some time. - I will start losartan 25 mg once a day.  She said trouble in the past swallowing larger pills.  Palpitations - Sometimes will feel her heart racing.  Unexpected.  We will check a Zio patch monitor  Chest pain - Can happen with exertional activity.  Tightness sensation.  Given her strong family history, hypertension we will go ahead and proceed with coronary CT scan.  Obviously if there is coronary calcification present, we will advocate for statin therapy.  We will also have her see the hypertension clinic in 1 month.  Me in 3 months.         Medication Adjustments/Labs and Tests Ordered: Current medicines are reviewed at length with the patient today.  Concerns regarding medicines are outlined above.  Orders Placed This Encounter  Procedures  . CT CORONARY MORPH W/CTA COR W/SCORE W/CA W/CM &/OR WO/CM  . CT CORONARY FRACTIONAL FLOW RESERVE DATA PREP  . CT CORONARY FRACTIONAL FLOW RESERVE FLUID ANALYSIS  . LONG TERM MONITOR (3-14 DAYS)  . EKG 12-Lead   Meds ordered this encounter  Medications  . metoprolol tartrate (LOPRESSOR) 100 MG tablet    Sig: Take 1 tablet (100 mg total) by mouth once for 1 dose. Take 1 tablet 2 hr before coronary CT    Dispense:  1 tablet    Refill:  0  . losartan (COZAAR) 25 MG tablet    Sig: Take 1 tablet (25 mg total) by mouth daily.    Dispense:  90 tablet    Refill:  3    Patient Instructions  Medication Instructions:  Please start Losartan 25 mg once a day. Continue all other medications as listed.  *If you need a refill on your cardiac medications before your next appointment, please call your pharmacy*  You have been referred to our Hypertension Clinic here at [facility] will need to be seen in 1 month.  Testing/Procedures: Bryn Gulling- Long Term Monitor  Instructions   Your physician has requested you wear your ZIO patch monitor 14 days.   This is a single patch monitor.  [employer] supplies one patch monitor per enrollment.  Additional stickers are not available.   Please do not apply patch if you will be having a Nuclear Stress Test, Echocardiogram, Cardiac CT, MRI, or Chest Xray during the time frame you would be wearing the monitor. The patch cannot be worn during these tests.  You cannot remove and re-apply the ZIO XT patch monitor.   Your ZIO patch monitor will be sent USPS Priority mail from [facility] directly to your home address. The monitor may also be mailed to a PO BOX if home delivery is not available.   It may take 3-5 days to receive your monitor after you have been enrolled.   Once you have received you monitor, please review enclosed instructions.  Your monitor has already been registered assigning a specific monitor serial # to you.   Applying the monitor   Shave hair from upper left chest.   Hold abrader disc by orange tab.  Rub abrader in 40 strokes over left upper chest as indicated in your monitor instructions.   Clean area with 4 enclosed alcohol pads .  Use all pads to assure are is cleaned thoroughly.  Let dry.   Apply patch as indicated in monitor instructions.  Patch will be place under collarbone on left side of chest with arrow pointing upward.   Rub patch adhesive wings for 2 minutes.Remove white label marked "1".  Remove white label marked "2".  Rub patch adhesive wings for 2 additional minutes.   While looking in a mirror, press and release button in center of patch.  A small green light will flash 3-4 times .  This will be your only indicator the monitor has  been turned on.     Do not shower for the first 24 hours.  You may shower after the first 24 hours.   Press button if you feel a symptom. You will hear a small click.  Record Date, Time and Symptom in the Patient Log Book.   When you are  ready to remove patch, follow instructions on last 2 pages of Patient Log Book.  Stick patch monitor onto last page of Patient Log Book.   Place Patient Log Book in [place] box.  Use locking tab on box and tape box closed securely.  The [employer] has [employer] on it.  Please place in mailbox as soon as possible.  Your physician should have your test results approximately 7 days after the monitor has been mailed back to [facility].   Call [place] at [phone] if you have questions regarding your ZIO XT patch monitor.  Call them immediately if you see an orange light blinking on your monitor.   If your monitor falls off in less than 4 days contact our Monitor department at [phone].  If your monitor becomes loose or falls off after 4 days call [employer] at [phone] for suggestions on securing your monitor.   Your cardiac CT will be scheduled at:   [facility] [address] [place], [place] [address] [phone]  Please arrive at the [facility] main entrance of [facility] 30 minutes prior to test start time. Proceed to the [facility] Radiology Department (first floor) to check-in and test prep.  Please follow these instructions carefully (unless otherwise directed):  On the Night Before the Test: . Be sure to Drink plenty of water. . Do not consume any caffeinated/decaffeinated beverages or chocolate 12 hours prior to your test. . Do not take any antihistamines 12 hours prior to your test.  On the Day of the Test: . Drink plenty of water. Do not drink any water within one hour of the test. . Do not eat any food 4 hours prior to the test. . You may take your regular medications prior to the test.  . Take metoprolol (Lopressor) two hours prior to test. . HOLD Furosemide/Hydrochlorothiazide morning of the test. . FEMALES- please wear underwire-free bra if available  After the Test: . Drink plenty of  water. . After receiving IV contrast, you may experience a mild flushed feeling. This is normal. . On occasion, you may experience a mild rash up to 24 hours after the test. This is not dangerous. If this occurs, you can take Benadryl 25 mg and increase your fluid intake. . If you experience trouble breathing, this can be serious. If it is severe call 911 IMMEDIATELY. If it is mild, please call our office.  Once we have confirmed authorization from your insurance company, we will call you to set up a date and time for your test. Based on how quickly your insurance processes prior authorizations requests, please allow up to 4 weeks to be contacted for scheduling your Cardiac CT appointment. Be advised that routine Cardiac CT appointments could be scheduled as many as 8 weeks after your provider has ordered it.  For non-scheduling related questions, please contact the cardiac imaging nurse navigator should you have any questions/concerns: Marchia Bond, Cardiac Imaging Nurse Navigator Burley Saver, Interim Cardiac Imaging Nurse [place] and V[facility] Services Direct Office Dial: [phone]   For scheduling needs, including cancellations and rescheduling, please call [place], [phone].  Follow-Up: At [facility], you and your health needs are our priority.  As part of our continuing mission to provide you with exceptional heart care, we have created designated Provider Care Teams.  These Care Teams include your primary Cardiologist (physician) and Advanced Practice Providers (APPs -  Physician Assistants and Nurse Practitioners) who all work together to provide you with the care you need, when you need it.  We recommend signing up for the patient portal called "MyChart".  Sign up information is provided on this After Visit Summary.  MyChart is used to connect with patients for Virtual Visits (Telemedicine).  Patients are able to view lab/test results, encounter notes, upcoming  appointments, etc.  Non-urgent messages can be sent to your provider as well.   To learn more about what you can do with MyChart, go to [email or web address].    Your next appointment:   3 month(s)  The format for your next appointment:   In Person  Provider:   Candee Furbish, MD   Thank you for choosing [facility]!!         Signed, Candee Furbish, MD  08/28/2020 10:13 AM    [place]

## 2020-09-15 ENCOUNTER — Telehealth (HOSPITAL_COMMUNITY): Payer: Self-pay | Admitting: *Deleted

## 2020-09-15 NOTE — Telephone Encounter (Signed)
Reaching out to patient to offer assistance regarding upcoming cardiac imaging study; pt verbalizes understanding of appt date/time, parking situation and where to check in, pre-test NPO status and medications ordered, and verified current allergies; name and call back number provided for further questions should they arise  Jock Mahon RN Navigator Cardiac Imaging [facility] [phone] office [phone] cell  

## 2020-09-17 ENCOUNTER — Ambulatory Visit (HOSPITAL_COMMUNITY)
Admission: RE | Admit: 2020-09-17 | Discharge: 2020-09-17 | Disposition: A | Discharge status: Home / Self Care | Payer: 59 | Source: Ambulatory Visit | Attending: Cardiology | Admitting: Cardiology

## 2020-09-17 ENCOUNTER — Encounter: Payer: Self-pay | Admitting: *Deleted

## 2020-09-17 ENCOUNTER — Ambulatory Visit (HOSPITAL_COMMUNITY): Admission: RE | Admit: 2020-09-17 | Payer: 59 | Source: Ambulatory Visit

## 2020-09-17 ENCOUNTER — Other Ambulatory Visit: Payer: Self-pay

## 2020-09-17 DIAGNOSIS — Z006 Encounter for examination for normal comparison and control in clinical research program: Secondary | ICD-10-CM

## 2020-09-17 DIAGNOSIS — R072 Precordial pain: Secondary | ICD-10-CM | POA: Diagnosis present

## 2020-09-17 MED ORDER — NITROGLYCERIN 0.4 MG SL SUBL
0.8000 mg | SUBLINGUAL_TABLET | Freq: Once | SUBLINGUAL | Status: AC | PRN
  Administered 2020-09-17: 0.8 mg via SUBLINGUAL

## 2020-09-17 MED ORDER — NITROGLYCERIN 0.4 MG SL SUBL
SUBLINGUAL_TABLET | SUBLINGUAL | Status: AC
  Filled 2020-09-17: qty 2

## 2020-09-17 MED ORDER — IOHEXOL 350 MG/ML SOLN
80.0000 mL | Freq: Once | INTRAVENOUS | Status: AC | PRN
  Administered 2020-09-17: 80 mL via INTRAVENOUS

## 2020-09-17 NOTE — Progress Notes (Signed)
Zio patch removed for CT coronary to prevent occurrence of artifact during imaging. Zio patch adhesive not able to stick to patient after removal for CT coronary.  Marchia Bond RN spoke with Dr. Marlou Porch, he advised that patient does not need to reapply Zio patch at this time.  Patient verbalized understanding.

## 2020-09-17 NOTE — Research (Signed)
IDENTIFY Informed Consent                  Subject Name:   Adah Stoneberg   Subject met inclusion and exclusion criteria.  The informed consent form, study requirements and expectations were reviewed with the subject and questions and concerns were addressed prior to the signing of the consent form.  The subject verbalized understanding of the trial requirements.  The subject agreed to participate in the IDENTIFY trial and signed the informed consent.  The informed consent was obtained prior to performance of any protocol-specific procedures for the subject.  A copy of the signed informed consent was given to the subject and a copy was placed in the subject's medical record.   [place] Maysin Carstens, Research Assistant  01/26/202212:08 p.m.

## 2020-09-29 ENCOUNTER — Encounter: Payer: Self-pay | Admitting: Pharmacist

## 2020-09-29 ENCOUNTER — Other Ambulatory Visit: Payer: Self-pay

## 2020-09-29 ENCOUNTER — Ambulatory Visit (INDEPENDENT_AMBULATORY_CARE_PROVIDER_SITE_OTHER): Payer: 59 | Admitting: Pharmacist

## 2020-09-29 VITALS — BP 132/88 | HR 78

## 2020-09-29 DIAGNOSIS — R002 Palpitations: Secondary | ICD-10-CM | POA: Diagnosis not present

## 2020-09-29 DIAGNOSIS — Z8249 Family history of ischemic heart disease and other diseases of the circulatory system: Secondary | ICD-10-CM

## 2020-09-29 NOTE — Patient Instructions (Addendum)
It was ncie meeting you today!  We would like your blood pressure to be less than 130/80  Continue your hydrochlorothiazide 25 mg once daily Continue your losartan 25 mg once daily  Continue checking your blood pressure at home.  If you see it start to trend up, please call and we can increase a medication  We will check your cholesterol on Wednesday and I will call you with the results  Karren Cobble, PharmD, BCACP, [place], [place] [address], [place], [place] [address] Phone: [phone]; Fax: [phone] 09/29/2020 3:58 PM

## 2020-09-29 NOTE — Progress Notes (Signed)
Patient ID: Stephanie Thomas                 DOB: 03-16-1966                      MRN: [id number]     HPI: Stephanie Thomas is a 55 y.o. female referred by Dr. Marlou Porch to HTN clinic. PMH is significant for palpitations and a family hisotry of early CAD.  Sister and mother both had CABG at 20 years old.    Patient is Glass blower/designer at [employer].  Reports high stress job, which has been made more stressful due to Covid and staffing issues.  Often has to work on the floor.  Drinks one coke zero in the morning and has stopped adding salt to her food (although she reports she used to use a lot of salt in her younger days).    Uses [employer] BP cuff which syncs to an app on her phone.  Does not test every day but does test a few times a week.  Discontinued amlodipine on 08/28/20 at last visit with Dr Marlou Porch when losartan was started.  Amlodipine caused significant ankle and leg swelling.  Has tolerated losartan and HCTZ well.  Home BP readings: 110/67 125/76 120/78 126/76  Drinks alcohol on Fridays and Saturdays with husbands, typically 1-2 drinks.  Does not use any tobacco.  Has not had lab work since 2020.  Had coronary CT on 09/17/20 which showed a coronary calcium score of zero.  Current HTN meds: HCTZ 25mg  daily, losartan 25mg  daily,  Previously tried: amlodipine 5 mg and 10mg  BP goal: <130/80  Family History: CAD (mother, sister)  Diet: Chicken, fish, steamed vegetables.  Eats red meat in Summer, typically avoids at other times.  Occasionally eats pork.   Wt Readings from Last 3 Encounters:  08/28/20 139 lb 3.2 oz (63.1 kg)  03/29/19 132 lb (59.9 kg)  03/15/19 132 lb (59.9 kg)   BP Readings from Last 3 Encounters:  09/17/20 118/70  08/28/20 120/90  03/29/19 112/61   Pulse Readings from Last 3 Encounters:  09/17/20 62  08/28/20 79  03/29/19 60    Renal function: CrCl cannot be calculated (Patient's most recent lab result is older than the maximum 21 days  allowed.).  Past Medical History:  Diagnosis Date  . Allergy    SEASONAL  . Family hx of colon cancer    mother- late 33's, early 87's  . GERD (gastroesophageal reflux disease)   . Hyperplastic colon polyp 03/30/2013  . Hypertension   . Sponge kidney     Current Outpatient Medications on File Prior to Visit  Medication Sig Dispense Refill  . amLODipine (NORVASC) 10 MG tablet Take 10 mg by mouth daily. Per patient taking 1/2 tablet    . escitalopram (LEXAPRO) 20 MG tablet Take 20 mg by mouth daily.    . hydrochlorothiazide (HYDRODIURIL) 25 MG tablet Take 25 mg by mouth daily.    Marland Kitchen losartan (COZAAR) 25 MG tablet Take 1 tablet (25 mg total) by mouth daily. 90 tablet 3  . metoprolol tartrate (LOPRESSOR) 100 MG tablet Take 1 tablet (100 mg total) by mouth once for 1 dose. Take 1 tablet 2 hr before coronary CT 1 tablet 0   Current Facility-Administered Medications on File Prior to Visit  Medication Dose Route Frequency Provider Last Rate Last Admin  . 0.9 %  sodium chloride infusion  500 mL Intravenous Once Doran Stabler, MD  Allergies  Allergen Reactions  . Flagyl [Metronidazole Hcl] Rash  . Sulfa Antibiotics Rash  . Tetracyclines & Related Rash     Assessment/Plan:  1. Hypertension - Patient's BP in room today 132/88 which is above goal of <130/80, however all of patient's home readings at goal since starting losartan.  Hesitant to increase losartan at this time since home readings at goal.  Recommended patient continue to monitor BP at home and if readings begin to trend up, to call HTN clinic for further dose titration.  Patient overdue for lab work.  Has not had metabolic panel or lipid profile since 2020.  Patient does not have an appointment scheduled with PCP so she is willing to update labs.  Will update CMP today and lipids later in the week when she is fasting (triglycerides elevated in 2020).  Recheck as needed.  Continue losartan 25 mg daily Continue HCTZ  25 mg daily Update CMP Update lipid profile  Karren Cobble, PharmD, BCACP, CDCES, [place] [address], [place],  [address] Phone: [phone]; Fax: [phone] 09/29/2020 4:39 PM

## 2020-09-30 LAB — COMPREHENSIVE METABOLIC PANEL
ALT: 17 IU/L (ref 0–32)
AST: 21 IU/L (ref 0–40)
Albumin/Globulin Ratio: 2.4 — ABNORMAL HIGH (ref 1.2–2.2)
Albumin: 5 g/dL — ABNORMAL HIGH (ref 3.8–4.9)
Alkaline Phosphatase: 97 IU/L (ref 44–121)
BUN/Creatinine Ratio: 22 (ref 9–23)
BUN: 16 mg/dL (ref 6–24)
Bilirubin Total: <0.2 mg/dL (ref 0.0–1.2)
CO2: 23 mmol/L (ref 20–29)
Calcium: 9.6 mg/dL (ref 8.7–10.2)
Chloride: 98 mmol/L (ref 96–106)
Creatinine, Ser: 0.74 mg/dL (ref 0.57–1.00)
GFR calc Af Amer: 106 mL/min/{1.73_m2} (ref >59)
GFR calc non Af Amer: 92 mL/min/{1.73_m2} (ref >59)
Globulin, Total: 2.1 g/dL (ref 1.5–4.5)
Glucose: 104 mg/dL — ABNORMAL HIGH (ref 65–99)
Potassium: 4.3 mmol/L (ref 3.5–5.2)
Sodium: 139 mmol/L (ref 134–144)
Total Protein: 7.1 g/dL (ref 6.0–8.5)

## 2020-10-01 ENCOUNTER — Other Ambulatory Visit: Payer: 59

## 2020-11-26 ENCOUNTER — Ambulatory Visit: Payer: 59 | Admitting: Cardiology

## 2020-12-22 NOTE — Progress Notes (Signed)
Cardiology Office Note    Date:  12/31/2020   ID:  Eddie Koc, DOB Jan 06, 1966, MRN [id number]   PCP:  Sharilyn Sites, [facility]  Cardiologist:  Candee Furbish, MD  Advanced Practice Provider:  No care team member to display Electrophysiologist:  None   [phone]   Chief Complaint  Patient presents with  . Follow-up    History of Present Illness:  Stephanie Thomas is a 55 y.o. female with history of hypertension, palpitations, strong family history of coronary disease with sister having CABG in her 8s mother also at 52.  Low risk NST 2018  Patient saw Dr. Marlou Porch 08/28/2020 with difficult to control hypertension and developed ankle swelling on higher dose amlodipine.  Losartan 25 mg was added.  She was also having some palpitations and chest pain ZIO showed normal sinus rhythm with rare PACs and PVCs, 4 brief episodes of paroxysmal atrial tachycardia maximum heart rate 141 asymptomatic.  Coronary CTA 09/17/2020 calcium score 0 no evidence of CAD.  Follow-up with hypertension clinic 09/29/2020 blood pressure was controlled.  Patient comes in for f/u. Still having some palpitations-some weeks more frequent than others. Overall doing better. No regular exercise outside of work. BP well controlled. Reviewed above tests in detail.  Past Medical History:  Diagnosis Date  . Allergy    SEASONAL  . Family hx of colon cancer    mother- late 82's, early 57's  . GERD (gastroesophageal reflux disease)   . Hyperplastic colon polyp 03/30/2013  . Hypertension   . Sponge kidney     Past Surgical History:  Procedure Laterality Date  . COLONOSCOPY  2014   multiple   . LAPAROSCOPY  1995  . POLYPECTOMY     HPP     Current Medications: Current Meds  Medication Sig  . cetirizine (ZYRTEC ALLERGY) 10 MG tablet Take 10 mg by mouth as needed for allergies.  Marland Kitchen escitalopram (LEXAPRO) 20 MG tablet Take 20 mg by mouth daily.  . hydrochlorothiazide (HYDRODIURIL) 25 MG  tablet Take 25 mg by mouth daily.  Marland Kitchen losartan (COZAAR) 25 MG tablet Take 1 tablet (25 mg total) by mouth daily.  . metoprolol tartrate (LOPRESSOR) 25 MG tablet Take 1/2 to 1 tablet daily as needed for Palpitations   Current Facility-Administered Medications for the 12/31/20 encounter (Office Visit) with Imogene Burn, PA-C  Medication  . 0.9 %  sodium chloride infusion     Allergies:   Amlodipine, Flagyl [metronidazole hcl], Sulfa antibiotics, and Tetracyclines & related   Social History   Socioeconomic History  . Marital status: Married    Spouse name: Not on file  . Number of children: Not on file  . Years of education: Not on file  . Highest education level: Not on file  Occupational History  . Not on file  Tobacco Use  . Smoking status: Never Smoker  . Smokeless tobacco: Never Used  Vaping Use  . Vaping Use: Never used  Substance and Sexual Activity  . Alcohol use: Yes    Alcohol/week: 0.0 standard drinks    Comment: Rare  . Drug use: No  . Sexual activity: Yes    Partners: Male    Birth control/protection: Other-see comments    Comment: vasectomy-1st intercourse 8 yo-5 partners  Other Topics Concern  . Not on file  Social History Narrative  . Not on file   Social Determinants of Health   Financial Resource Strain: Not on file  Food Insecurity:  Not on file  Transportation Needs: Not on file  Physical Activity: Not on file  Stress: Not on file  Social Connections: Not on file     Family History:  The patient's family history includes Colon cancer in her mother; Crohn's disease in her sister; Diabetes in her mother; Heart disease in her father, mother, and sister; Hypertension in her father, mother, and sister; Multiple sclerosis in her sister.   ROS:   Please see the history of present illness.    ROS All other systems reviewed and are negative.   PHYSICAL EXAM:   VS:  BP 120/68   Pulse 72   Ht 5\' 1"  (1.549 m)   Wt 137 lb (62.1 kg)   LMP 07/04/2017    SpO2 96%   BMI 25.89 kg/m   Physical Exam  GEN: Well nourished, well developed, in no acute distress  Neck: no JVD, carotid bruits, or masses Cardiac:RRR; no murmurs, rubs, or gallops  Respiratory:  clear to auscultation bilaterally, normal work of breathing GI: soft, nontender, nondistended, + BS Ext: without cyanosis, clubbing, or edema, Good distal pulses bilaterally Neuro:  Alert and Oriented x 3 Psych: euthymic mood, full affect  Wt Readings from Last 3 Encounters:  12/31/20 137 lb (62.1 kg)  08/28/20 139 lb 3.2 oz (63.1 kg)  03/29/19 132 lb (59.9 kg)      Studies/Labs Reviewed:   EKG:  EKG is not ordered today.   Recent Labs: 09/29/2020: ALT 17; BUN 16; Creatinine, Ser 0.74; Potassium 4.3; Sodium 139   Lipid Panel    Component Value Date/Time   CHOL 184 08/15/2013 1125   TRIG 148 08/15/2013 1125   HDL 81 08/15/2013 1125   CHOLHDL 2.3 08/15/2013 1125   VLDL 30 08/15/2013 1125   LDLCALC 73 08/15/2013 1125    Additional studies/ records that were reviewed today include:  Coronary CTA 1/26/2022IMPRESSION: 1. Coronary calcium score of 0. This was low risk.   2. Normal coronary origin with right dominance.   3. No evidence of CAD.   4. CAD-RADS 0. No evidence of CAD (0%). Consider non-atherosclerotic causes of chest pain.   Candee Furbish, MD [facility]     Monitor 08/2020 Sinus rhythm with average heart rate of 75 bpm. Normal heart rate variability. No pauses, no atrial fibrillation. Sensation of heart pounding, fluttering was associated with sinus rhythm. Rare PACs, rare PVCs. Benign. 4 brief episodes of paroxysmal atrial tachycardia, fastest interval lasting 8 beats with maximal heart rate 141 bpm. Benign, asymptomatic.    Risk Assessment/Calculations:         ASSESSMENT:    1. Essential hypertension   2. Palpitations   3. Family history of early CAD   73. Chest pain, unspecified type      PLAN:  In order of problems listed above:  Hypertension  controlled on losartan and HCTZ-check labs today  Palpitations ZI0 monitor 08/2018 reassuring with rare PACs and PVCs and 4 short runs of PAT asymptomatic and benign-didn't have a lot of symptoms while wearing monitor but still has some palpitations. Can take metoprolol 25 mg 1/2-1 tablet prn. Check labs including TSH  Family history of early CAD  History of chest pain calcium score 0 and no CAD on coronary CT 08/2020  Shared Decision Making/Informed Consent        Medication Adjustments/Labs and Tests Ordered: Current medicines are reviewed at length with the patient today.  Concerns regarding medicines are outlined above.  Medication changes, Labs and Tests ordered  today are listed in the Patient Instructions below. Patient Instructions  Medication Instructions:  Your physician has recommended you make the following change in your medication:   START: Metoprolol Tartrate 25mg . 1/2-1 tablet daily as needed for Palpitations  *If you need a refill on your cardiac medications before your next appointment, please call your pharmacy*   Lab Work: TODAY: CMET, CBC, TSH, FLP  If you have labs (blood work) drawn today and your tests are completely normal, you will receive your results only by: Marland Kitchen MyChart Message (if you have MyChart) OR . A paper copy in the mail If you have any lab test that is abnormal or we need to change your treatment, we will call you to review the results.   Follow-Up: At [facility], you and your health needs are our priority.  As part of our continuing mission to provide you with exceptional heart care, we have created designated Provider Care Teams.  These Care Teams include your primary Cardiologist (physician) and Advanced Practice Providers (APPs -  Physician Assistants and Nurse Practitioners) who all work together to provide you with the care you need, when you need it.  Your next appointment:   1 year(s)  The format for your next appointment:   In  Person  Provider:   You may see Candee Furbish, MD or one of the following Advanced Practice Providers on your designated Care Team:    Kathyrn Drown, NP   Other Instructions Your provider recommends that you maintain 150 minutes per week of moderate aerobic activity.      Sumner Boast, PA-C  12/31/2020 9:19 AM    [facility] [address], [place], [place]  [address] Phone: [phone]; Fax: [phone]

## 2020-12-31 ENCOUNTER — Encounter: Payer: Self-pay | Admitting: Physician Assistant

## 2020-12-31 ENCOUNTER — Ambulatory Visit: Payer: 59 | Admitting: Physician Assistant

## 2020-12-31 ENCOUNTER — Other Ambulatory Visit: Payer: Self-pay

## 2020-12-31 ENCOUNTER — Encounter: Payer: Self-pay | Admitting: *Deleted

## 2020-12-31 VITALS — BP 120/68 | HR 72 | Ht 61.0 in | Wt 137.0 lb

## 2020-12-31 DIAGNOSIS — Z006 Encounter for examination for normal comparison and control in clinical research program: Secondary | ICD-10-CM

## 2020-12-31 DIAGNOSIS — I10 Essential (primary) hypertension: Secondary | ICD-10-CM

## 2020-12-31 DIAGNOSIS — R002 Palpitations: Secondary | ICD-10-CM

## 2020-12-31 DIAGNOSIS — Z8249 Family history of ischemic heart disease and other diseases of the circulatory system: Secondary | ICD-10-CM

## 2020-12-31 DIAGNOSIS — R079 Chest pain, unspecified: Secondary | ICD-10-CM

## 2020-12-31 LAB — CBC
Hematocrit: 41.8 % (ref 34.0–46.6)
Hemoglobin: 14 g/dL (ref 11.1–15.9)
MCH: 31.4 pg (ref 26.6–33.0)
MCHC: 33.5 g/dL (ref 31.5–35.7)
MCV: 94 fL (ref 79–97)
Platelets: 197 10*3/uL (ref 150–450)
RBC: 4.46 x10E6/uL (ref 3.77–5.28)
RDW: 12 % (ref 11.7–15.4)
WBC: 4.5 10*3/uL (ref 3.4–10.8)

## 2020-12-31 LAB — COMPREHENSIVE METABOLIC PANEL
ALT: 19 IU/L (ref 0–32)
AST: 19 IU/L (ref 0–40)
Albumin/Globulin Ratio: 2.2 (ref 1.2–2.2)
Albumin: 5 g/dL — ABNORMAL HIGH (ref 3.8–4.9)
Alkaline Phosphatase: 93 IU/L (ref 44–121)
BUN/Creatinine Ratio: 26 — ABNORMAL HIGH (ref 9–23)
BUN: 17 mg/dL (ref 6–24)
Bilirubin Total: 0.3 mg/dL (ref 0.0–1.2)
CO2: 26 mmol/L (ref 20–29)
Calcium: 10.2 mg/dL (ref 8.7–10.2)
Chloride: 99 mmol/L (ref 96–106)
Creatinine, Ser: 0.66 mg/dL (ref 0.57–1.00)
Globulin, Total: 2.3 g/dL (ref 1.5–4.5)
Glucose: 96 mg/dL (ref 65–99)
Potassium: 4.1 mmol/L (ref 3.5–5.2)
Sodium: 141 mmol/L (ref 134–144)
Total Protein: 7.3 g/dL (ref 6.0–8.5)
eGFR: 104 mL/min/{1.73_m2} (ref >59)

## 2020-12-31 LAB — LIPID PANEL
Chol/HDL Ratio: 4 ratio (ref 0.0–4.4)
Cholesterol, Total: 237 mg/dL — ABNORMAL HIGH (ref 100–199)
HDL: 59 mg/dL (ref >39)
LDL Chol Calc (NIH): 141 mg/dL — ABNORMAL HIGH (ref 0–99)
Triglycerides: 209 mg/dL — ABNORMAL HIGH (ref 0–149)
VLDL Cholesterol Cal: 37 mg/dL (ref 5–40)

## 2020-12-31 LAB — TSH: TSH: 0.833 u[IU]/mL (ref 0.450–4.500)

## 2020-12-31 MED ORDER — METOPROLOL TARTRATE 25 MG PO TABS
ORAL_TABLET | ORAL | 3 refills | Status: DC
Start: 2020-12-31 — End: 2021-11-26

## 2020-12-31 NOTE — Research (Signed)
Spoke to patient for 90 day phone call for Identify study. States she still has some problems and was started on metoprolol today. Will call her back in January for 1 year phone call.      Stephanie Thomas  12/31/2020  16:35 p,m.

## 2020-12-31 NOTE — Patient Instructions (Signed)
Medication Instructions:  Your physician has recommended you make the following change in your medication:   START: Metoprolol Tartrate 25mg . 1/2-1 tablet daily as needed for Palpitations  *If you need a refill on your cardiac medications before your next appointment, please call your pharmacy*   Lab Work: TODAY: CMET, CBC, TSH, FLP  If you have labs (blood work) drawn today and your tests are completely normal, you will receive your results only by: Marland Kitchen MyChart Message (if you have MyChart) OR . A paper copy in the mail If you have any lab test that is abnormal or we need to change your treatment, we will call you to review the results.   Follow-Up: At [facility], you and your health needs are our priority.  As part of our continuing mission to provide you with exceptional heart care, we have created designated Provider Care Teams.  These Care Teams include your primary Cardiologist (physician) and Advanced Practice Providers (APPs -  Physician Assistants and Nurse Practitioners) who all work together to provide you with the care you need, when you need it.  Your next appointment:   1 year(s)  The format for your next appointment:   In Person  Provider:   You may see Candee Furbish, MD or one of the following Advanced Practice Providers on your designated Care Team:    Kathyrn Drown, NP   Other Instructions Your provider recommends that you maintain 150 minutes per week of moderate aerobic activity.

## 2021-01-01 ENCOUNTER — Other Ambulatory Visit: Payer: Self-pay

## 2021-01-01 DIAGNOSIS — Z8249 Family history of ischemic heart disease and other diseases of the circulatory system: Secondary | ICD-10-CM

## 2021-01-06 ENCOUNTER — Other Ambulatory Visit: Payer: Self-pay

## 2021-01-06 MED ORDER — ATORVASTATIN CALCIUM 10 MG PO TABS
10.0000 mg | ORAL_TABLET | Freq: Every day | ORAL | 3 refills | Status: DC
Start: 2021-01-06 — End: 2022-01-01

## 2021-04-29 ENCOUNTER — Ambulatory Visit: Payer: 59 | Admitting: Obstetrics & Gynecology

## 2021-04-29 ENCOUNTER — Other Ambulatory Visit: Payer: Self-pay | Admitting: Family Medicine

## 2021-04-29 DIAGNOSIS — Z1231 Encounter for screening mammogram for malignant neoplasm of breast: Secondary | ICD-10-CM

## 2021-04-30 ENCOUNTER — Ambulatory Visit: Payer: 59 | Admitting: Obstetrics & Gynecology

## 2021-05-07 ENCOUNTER — Other Ambulatory Visit: Payer: Self-pay

## 2021-05-07 ENCOUNTER — Ambulatory Visit
Admission: RE | Admit: 2021-05-07 | Discharge: 2021-05-07 | Disposition: A | Discharge status: Home / Self Care | Payer: 59 | Source: Ambulatory Visit | Attending: Family Medicine | Admitting: Family Medicine

## 2021-05-07 DIAGNOSIS — Z1231 Encounter for screening mammogram for malignant neoplasm of breast: Secondary | ICD-10-CM

## 2021-08-05 ENCOUNTER — Other Ambulatory Visit: Payer: Self-pay | Admitting: Cardiology

## 2021-09-08 ENCOUNTER — Other Ambulatory Visit: Payer: Self-pay | Admitting: Cardiology

## 2021-09-23 ENCOUNTER — Other Ambulatory Visit: Payer: Self-pay | Admitting: Obstetrics and Gynecology

## 2021-09-23 DIAGNOSIS — E2839 Other primary ovarian failure: Secondary | ICD-10-CM

## 2021-11-25 ENCOUNTER — Encounter: Payer: Self-pay | Admitting: Nurse Practitioner

## 2021-11-26 ENCOUNTER — Other Ambulatory Visit: Payer: Self-pay

## 2021-11-30 ENCOUNTER — Ambulatory Visit: Payer: 59 | Admitting: Nurse Practitioner

## 2021-12-02 ENCOUNTER — Ambulatory Visit (HOSPITAL_COMMUNITY)
Admission: RE | Admit: 2021-12-02 | Discharge: 2021-12-02 | Disposition: A | Discharge status: Home / Self Care | Payer: 59 | Source: Ambulatory Visit | Attending: Family Medicine | Admitting: Family Medicine

## 2021-12-02 ENCOUNTER — Other Ambulatory Visit (HOSPITAL_COMMUNITY): Payer: Self-pay | Admitting: Family Medicine

## 2021-12-02 DIAGNOSIS — S2249XA Multiple fractures of ribs, unspecified side, initial encounter for closed fracture: Secondary | ICD-10-CM

## 2021-12-15 ENCOUNTER — Encounter: Payer: Self-pay | Admitting: Cardiology

## 2022-01-01 ENCOUNTER — Other Ambulatory Visit: Payer: Self-pay | Admitting: Physician Assistant

## 2022-03-05 ENCOUNTER — Ambulatory Visit
Admission: RE | Admit: 2022-03-05 | Discharge: 2022-03-05 | Disposition: A | Discharge status: Home / Self Care | Payer: 59 | Source: Ambulatory Visit | Attending: Obstetrics and Gynecology | Admitting: Obstetrics and Gynecology

## 2022-03-05 DIAGNOSIS — E2839 Other primary ovarian failure: Secondary | ICD-10-CM

## 2022-03-10 ENCOUNTER — Other Ambulatory Visit: Payer: Self-pay | Admitting: Cardiology

## 2022-04-08 ENCOUNTER — Other Ambulatory Visit: Payer: Self-pay | Admitting: Family Medicine

## 2022-04-08 DIAGNOSIS — Z1231 Encounter for screening mammogram for malignant neoplasm of breast: Secondary | ICD-10-CM

## 2022-04-18 IMAGING — CT CT HEART MORP W/ CTA COR W/ SCORE W/ CA W/CM &/OR W/O CM
1 series · 2 of 2 positions shown, 3 images · non-contrast
Comparison: 01/25/2014 chest radiograph.
COMPARISON: 01/25/2014 chest radiograph.

Addendum:
EXAM:
OVER-READ INTERPRETATION  CT CHEST

The following report is an over-read performed by radiologist Dr.
Mami Then [REDACTED] on 09/17/2020. This over-read
does not include interpretation of cardiac or coronary anatomy or
pathology. The coronary CTA interpretation by the cardiologist is
attached.
CLINICAL DATA: 54 year old female with strong family history of
CAD, with chest pain.
Cardiac/Coronary  CTA
TECHNIQUE: The patient was scanned on a Phillips Force scanner.

[Series 308: — · 0.46mm/px · 2 of 2 slices shown, 3 images]
[im 1/2  vessel]
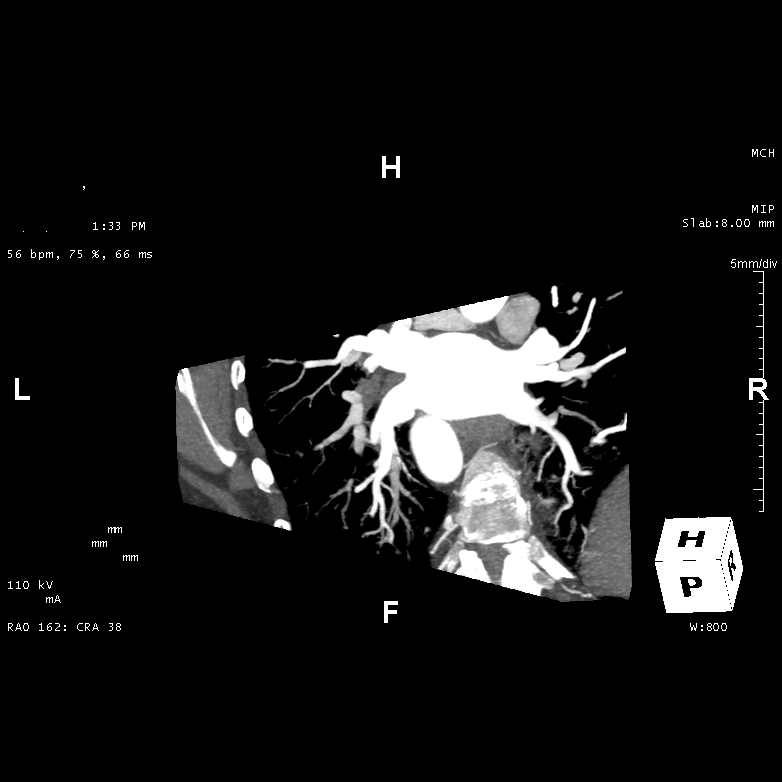
[im 1/2  lung]
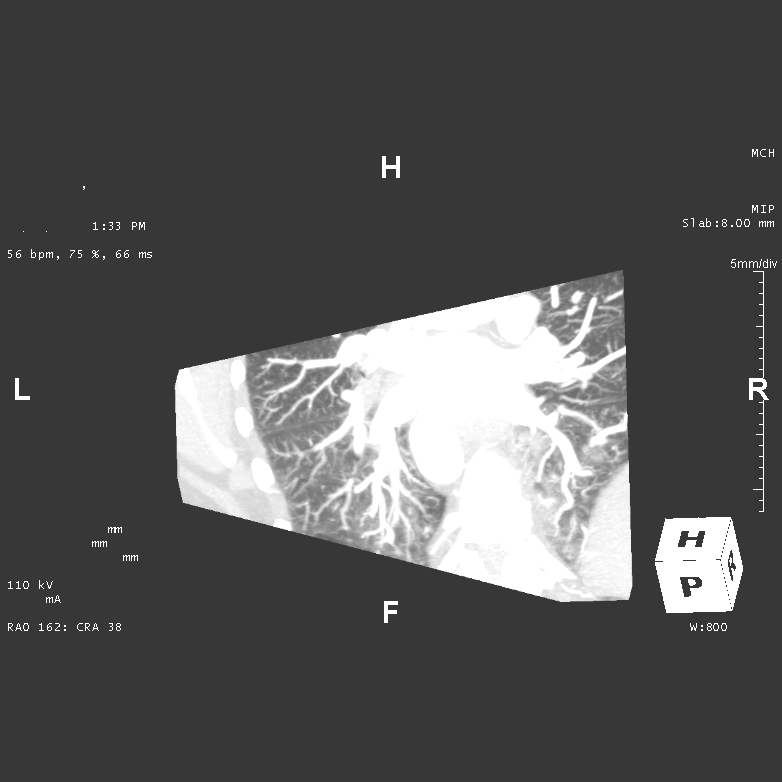
[im 2/2  vessel]
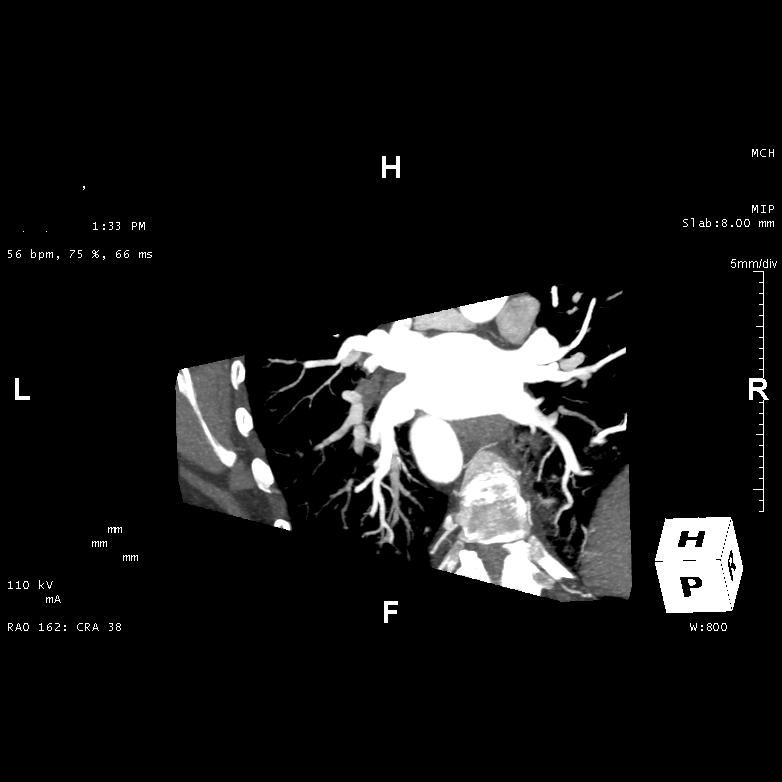

[2 of 2 positions shown; findings below may reference images not displayed]

FINDINGS: Vascular: Normal aortic caliber. No central pulmonary embolism, on
this non-dedicated study.

Mediastinum/Nodes: No imaged thoracic adenopathy.

Lungs/Pleura: No pleural fluid. Minimal ground-glass opacity in the
dependent lower lobes, likely due to subsegmental atelectasis and
hypoventilation/air trapping.

Upper Abdomen: Normal imaged portions of the liver, spleen, stomach.

Musculoskeletal: No acute osseous abnormality.
IMPRESSION: No acute findings in the imaged extracardiac chest.
FINDINGS: A 110 kV prospective scan was triggered in the descending thoracic
aorta at 111 HU's. Axial non-contrast 3 mm slices were carried out
through the heart. The data set was analyzed on a dedicated work
station and scored using the Agatson method. Gantry rotation speed
was 250 msecs and collimation was .6 mm. Beta blockade and 0.8 mg of
sl NTG was given. The 3D data set was reconstructed in 5% intervals
of the 67-82 % of the R-R cycle. Diastolic phases were analyzed on a
dedicated work station using MPR, MIP and VRT modes. The patient
received 80 cc of contrast.

Aorta:  Normal size.  No calcifications.  No dissection.

Aortic Valve:  Trileaflet.  No calcifications.

Coronary Arteries:  Normal coronary origin.  Right dominance.

RCA is a large dominant artery that gives rise to PDA and PLA. There
is no plaque.

Left main is a large artery that gives rise to LAD and LCX arteries.

LAD is a large vessel that has no plaque. There are 2 moderate sized
diagonal branches.

LCX is a non-dominant artery that gives rise to one large OM1
branch. There is no plaque.

Other findings:

Normal pulmonary vein drainage into the left atrium (right accessory
vein)

Normal left atrial appendage without a thrombus.

Normal size of the pulmonary artery.

Please see radiology report for non cardiac findings.
IMPRESSION: 1. Coronary calcium score of 0. This was low risk.

2. Normal coronary origin with right dominance.

3. No evidence of CAD.

4. CAD-RADS 0. No evidence of CAD (0%). Consider non-atherosclerotic
causes of chest pain.

*** End of Addendum ***
EXAM:
OVER-READ INTERPRETATION  CT CHEST

The following report is an over-read performed by radiologist Dr.
Mami Then [REDACTED] on 09/17/2020. This over-read
does not include interpretation of cardiac or coronary anatomy or
pathology. The coronary CTA interpretation by the cardiologist is
attached.
FINDINGS: Vascular: Normal aortic caliber. No central pulmonary embolism, on
this non-dedicated study.

Mediastinum/Nodes: No imaged thoracic adenopathy.

Lungs/Pleura: No pleural fluid. Minimal ground-glass opacity in the
dependent lower lobes, likely due to subsegmental atelectasis and
hypoventilation/air trapping.

Upper Abdomen: Normal imaged portions of the liver, spleen, stomach.

Musculoskeletal: No acute osseous abnormality.
IMPRESSION: No acute findings in the imaged extracardiac chest.

## 2022-05-24 ENCOUNTER — Ambulatory Visit
Admission: RE | Admit: 2022-05-24 | Discharge: 2022-05-24 | Disposition: A | Discharge status: Home / Self Care | Payer: 59 | Source: Ambulatory Visit | Attending: Family Medicine | Admitting: Family Medicine

## 2022-05-24 DIAGNOSIS — Z1231 Encounter for screening mammogram for malignant neoplasm of breast: Secondary | ICD-10-CM

## 2022-06-08 ENCOUNTER — Ambulatory Visit: Payer: 59 | Attending: Cardiology | Admitting: Cardiology

## 2022-06-08 ENCOUNTER — Encounter: Payer: Self-pay | Admitting: Cardiology

## 2022-06-08 VITALS — BP 130/80 | HR 74 | Ht 61.0 in | Wt 138.0 lb

## 2022-06-08 DIAGNOSIS — I10 Essential (primary) hypertension: Secondary | ICD-10-CM | POA: Diagnosis not present

## 2022-06-08 DIAGNOSIS — Z8249 Family history of ischemic heart disease and other diseases of the circulatory system: Secondary | ICD-10-CM

## 2022-06-08 DIAGNOSIS — R002 Palpitations: Secondary | ICD-10-CM | POA: Diagnosis not present

## 2022-06-08 NOTE — Progress Notes (Signed)
Cardiology Office Note:    Date:  06/08/2022   ID:  Stephanie Thomas, DOB 02/24/1966, MRN [id number]  PCP:  Sharilyn Sites, MD  [facility] Cardiologist:  Candee Furbish, MD  [facility] Electrophysiologist:  None   Referring MD: Sharilyn Sites, MD    History of Present Illness:    Stephanie Thomas is a 56 y.o. female here for strong family history of coronary artery disease evaluation at the request of Dr. Hilma Favors.  Sister had CABG in her 21's another sister had CABG as well and can, mother also at 50.  Coronary CT scan showed 0 plaque.  2022.  Palpitations- occasional.  Saw [place] previously in 2010 for this evaluation. Feels tired with these palpitations.  PVCs noted on monitor  Nuclear stress test was performed in 2018 which was low risk, no ischemia.  Today, she reports that since her last visit one of her other sisters has had a quadruple CABG. She is still taking the low dose of atorvastatin and is tolerating it well. She does report feeling some palpitations and skipped beats occasionally. She does not exercise formally and reports trying to eat healthy.   She denies any chest pain, shortness of breath, or peripheral edema. No lightheadedness, headaches, syncope, orthopnea, or PND.  Past Medical History:  Diagnosis Date   Allergy    SEASONAL   Family hx of colon cancer    mother- late 27's, early 92's   GERD (gastroesophageal reflux disease)    Hyperplastic colon polyp 03/30/2013   Hypertension    Sponge kidney     Past Surgical History:  Procedure Laterality Date   COLONOSCOPY  2014   multiple    LAPAROSCOPY  1995   POLYPECTOMY     HPP     Current Medications: Current Meds  Medication Sig   atorvastatin (LIPITOR) 10 MG tablet TAKE 1 TABLET(10 MG) BY MOUTH DAILY   cetirizine (ZYRTEC ALLERGY) 10 MG tablet Take 10 mg by mouth as needed for allergies.   escitalopram (LEXAPRO) 20 MG tablet Take 20 mg by mouth daily.   hydrochlorothiazide (HYDRODIURIL) 25 MG tablet  Take 25 mg by mouth daily.   losartan (COZAAR) 25 MG tablet Take 1 tablet (25 mg total) by mouth daily. Call and schedule overdue follow up visit to receive further refills. [phone]. 1st attempt.   pantoprazole (PROTONIX) 40 MG tablet Take 40 mg by mouth daily.   Current Facility-Administered Medications for the 06/08/22 encounter (Office Visit) with Jerline Pain, MD  Medication   0.9 %  sodium chloride infusion     Allergies:   Amlodipine, Flagyl [metronidazole hcl], Sulfa antibiotics, and Tetracyclines & related   Social History   Socioeconomic History   Marital status: Married    Spouse name: Not on file   Number of children: Not on file   Years of education: Not on file   Highest education level: Not on file  Occupational History   Not on file  Tobacco Use   Smoking status: Never   Smokeless tobacco: Never  Vaping Use   Vaping Use: Never used  Substance and Sexual Activity   Alcohol use: Yes    Alcohol/week: 0.0 standard drinks of alcohol    Comment: Rare   Drug use: No   Sexual activity: Yes    Partners: Male    Birth control/protection: Other-see comments    Comment: vasectomy-1st intercourse 52 yo-5 partners  Other Topics Concern   Not on file  Social History Narrative  Not on file   Social Determinants of Health   Financial Resource Strain: Not on file  Food Insecurity: Not on file  Transportation Needs: Not on file  Physical Activity: Not on file  Stress: Not on file  Social Connections: Not on file     Family History: The patient's family history includes Colon cancer in her mother; Crohn's disease in her sister; Diabetes in her mother; Heart disease in her father, mother, and sister; Hypertension in her father, mother, and sister; Multiple sclerosis in her sister. There is no history of Kidney disease, Liver disease, Colon polyps, Esophageal cancer, Rectal cancer, or Stomach cancer.  ROS:   Please see the history of present illness.    (+)  palpitations  All other systems reviewed and are negative.  EKGs/Labs/Other Studies Reviewed:    The following studies were reviewed today:  Long Term Monitor 09/2020: Sinus rhythm with average heart rate of 75 bpm. Normal heart rate variability. No pauses, no atrial fibrillation. Sensation of heart pounding, fluttering was associated with sinus rhythm. Rare PACs, rare PVCs. Benign. 4 brief episodes of paroxysmal atrial tachycardia, fastest interval lasting 8 beats with maximal heart rate 141 bpm. Benign, asymptomatic.   Reassuring monitor  CT Coronary Calcium Score 09/17/2020: IMPRESSION: 1. Coronary calcium score of 0. This was low risk.   2. Normal coronary origin with right dominance.   3. No evidence of CAD.   4. CAD-RADS 0. No evidence of CAD (0%). Consider non-atherosclerotic causes of chest pain.    Nuclear stress test 2018  low risk  EKG:  EKG is personally reviewed.  06/08/2022: Sinus rhythm 74 bpm. Non-specific T-wave changes. 08/28/2020: Sinus rhythm. 79 bpm with subtle T wave inversion noted in aVF.  Recent Labs: No results found for requested labs within last 365 days.  Recent Lipid Panel    Component Value Date/Time   CHOL 237 (H) 12/31/2020 0847   TRIG 209 (H) 12/31/2020 0847   HDL 59 12/31/2020 0847   CHOLHDL 4.0 12/31/2020 0847   CHOLHDL 2.3 08/15/2013 1125   VLDL 30 08/15/2013 1125   LDLCALC 141 (H) 12/31/2020 0847     Risk Assessment/Calculations:       Physical Exam:    VS:  BP 130/80 (BP Location: Left Arm, Patient Position: Sitting, Cuff Size: Normal)   Pulse 74   Ht '5\' 1"'$  (1.549 m)   Wt 138 lb (62.6 kg)   LMP 02/20/2017   SpO2 94%   BMI 26.07 kg/m     Wt Readings from Last 3 Encounters:  06/08/22 138 lb (62.6 kg)  12/31/20 137 lb (62.1 kg)  08/28/20 139 lb 3.2 oz (63.1 kg)     GEN:  Well nourished, well developed in no acute distress HEENT: Normal NECK: No JVD; No carotid bruits LYMPHATICS: No lymphadenopathy CARDIAC: RRR,  no murmurs, rubs, gallops RESPIRATORY:  Clear to auscultation without rales, wheezing or rhonchi  ABDOMEN: Soft, non-tender, non-distended MUSCULOSKELETAL:  No edema; No deformity  SKIN: Warm and dry NEUROLOGIC:  Alert and oriented x 3 PSYCHIATRIC:  Normal affect   ASSESSMENT:    1. Essential hypertension   2. Palpitations   3. Family history of early CAD     PLAN:    In order of problems listed above:  Family history of coronary artery disease -Coronary CT showed a calcium score of 0 and no evidence of plaque.  Excellent.  2022. - Nuclear stress test 2018 was low risk. - Both mother and 2 sisters had  bypass at age 21.  Has another sister that has stents.  She has had previous dyspnea on exertion.  Stairs for instance. - Primary prevention strategies.  LDL in the past has been 84.  Premature atrial contractions - Prior EKG showed 1 PAC.  She does feel palpitations occasionally at night.  Her husband could hear this on stethoscope.  Conservative measures.  Monitor overall reassuring.  Did show some PACs PVCs.  Brief atrial tachycardia.  HTN  - Continue to monitor blood pressures at home.  She does this via Bluetooth.  Overall doing well.  Today 130/80.  Continue with losartan 25 as well as hydrochlorothiazide 25.  Sometimes has trouble swallowing larger pills.     Follow Up: 1 year.    Medication Adjustments/Labs and Tests Ordered: Current medicines are reviewed at length with the patient today.  Concerns regarding medicines are outlined above.  Orders Placed This Encounter  Procedures   EKG 12-Lead   No orders of the defined types were placed in this encounter.   Patient Instructions   Medication Instructions:  Your physician recommends that you continue on your current medications as directed. Please refer to the Current Medication list given to you today.   *If you need a refill on your cardiac medications before your next appointment, please call your  pharmacy*   Follow-Up: At [facility], you and your health needs are our priority.  As part of our continuing mission to provide you with exceptional heart care, we have created designated Provider Care Teams.  These Care Teams include your primary Cardiologist (physician) and Advanced Practice Providers (APPs -  Physician Assistants and Nurse Practitioners) who all work together to provide you with the care you need, when you need it.   Your next appointment:   1 year(s)  The format for your next appointment:   In Person  Provider:   Candee Furbish, MD     Important Information About [place],acting as a scribe for Candee Furbish, MD.,have documented all relevant documentation on the behalf of Candee Furbish, MD,as directed by  Candee Furbish, MD while in the presence of Candee Furbish, MD.   I, Candee Furbish, MD, have reviewed all documentation for this visit. The documentation on 06/08/22 for the exam, diagnosis, procedures, and orders are all accurate and complete.   Signed, Candee Furbish, MD  06/08/2022 9:19 AM    [facility]

## 2022-06-08 NOTE — Patient Instructions (Signed)
  Medication Instructions:  Your physician recommends that you continue on your current medications as directed. Please refer to the Current Medication list given to you today.   *If you need a refill on your cardiac medications before your next appointment, please call your pharmacy*   Follow-Up: At [facility], you and your health needs are our priority.  As part of our continuing mission to provide you with exceptional heart care, we have created designated Provider Care Teams.  These Care Teams include your primary Cardiologist (physician) and Advanced Practice Providers (APPs -  Physician Assistants and Nurse Practitioners) who all work together to provide you with the care you need, when you need it.   Your next appointment:   1 year(s)  The format for your next appointment:   In Person  Provider:   Candee Furbish, MD     Important Information About Sugar

## 2022-08-10 ENCOUNTER — Other Ambulatory Visit: Payer: Self-pay | Admitting: Cardiology

## 2022-11-17 ENCOUNTER — Other Ambulatory Visit: Payer: Self-pay | Admitting: Otolaryngology

## 2022-11-17 DIAGNOSIS — K219 Gastro-esophageal reflux disease without esophagitis: Secondary | ICD-10-CM

## 2022-11-17 DIAGNOSIS — R1314 Dysphagia, pharyngoesophageal phase: Secondary | ICD-10-CM

## 2022-11-24 ENCOUNTER — Ambulatory Visit
Admission: RE | Admit: 2022-11-24 | Discharge: 2022-11-24 | Disposition: A | Discharge status: Home / Self Care | Payer: 59 | Source: Ambulatory Visit | Attending: Otolaryngology | Admitting: Otolaryngology

## 2022-11-24 DIAGNOSIS — R1314 Dysphagia, pharyngoesophageal phase: Secondary | ICD-10-CM

## 2022-11-24 DIAGNOSIS — K219 Gastro-esophageal reflux disease without esophagitis: Secondary | ICD-10-CM

## 2023-03-26 ENCOUNTER — Ambulatory Visit
Admission: EM | Admit: 2023-03-26 | Discharge: 2023-03-26 | Disposition: A | Discharge status: Home / Self Care | Payer: 59 | Attending: Nurse Practitioner | Admitting: Nurse Practitioner

## 2023-03-26 ENCOUNTER — Encounter: Payer: Self-pay | Admitting: Emergency Medicine

## 2023-03-26 DIAGNOSIS — J069 Acute upper respiratory infection, unspecified: Secondary | ICD-10-CM | POA: Diagnosis not present

## 2023-03-26 MED ORDER — PSEUDOEPH-BROMPHEN-DM 30-2-10 MG/5ML PO SYRP: 5.0000 mL | ORAL_SOLUTION | Freq: Four times a day (QID) | ORAL | 0 refills | Status: DC | PRN

## 2023-03-26 NOTE — ED Triage Notes (Signed)
Cough, nasal congestion, sore throat, and headache since Thursday.  States back of neck hurts.  Covid test yesterday was negative.  Has been taking nyquil to help with symptoms.

## 2023-03-26 NOTE — ED Provider Notes (Signed)
[facility]    CSN: [id number] Arrival date & time: 03/26/23  0901      History   Chief Complaint No chief complaint on file.   HPI Stephanie Thomas is a 57 y.o. female.   The history is provided by the patient.   The patient presents with a 2-day history of headache, sore throat, nasal congestion, cough, and neck pain.  She denies fever, chills, ear pain, ear drainage, wheezing, shortness of breath, difficulty breathing, chest pain, abdominal pain, nausea, vomiting, or diarrhea.  Patient reports that she was around her boss who was sick with the same or similar symptoms.  Patient states she took a COVID test yesterday which was negative.  She states she has been taking Tylenol for her symptoms.  Patient reports history of seasonal allergies.  Denies history of asthma.  Past Medical History:  Diagnosis Date   Allergy    SEASONAL   Family hx of colon cancer    mother- late 37's, early 78's   GERD (gastroesophageal reflux disease)    Hyperplastic colon polyp 03/30/2013   Hypertension    Sponge kidney     Patient Active Problem List   Diagnosis Date Noted   GERD (gastroesophageal reflux disease) 01/28/2014   Abdominal pain, epigastric 01/28/2014    Past Surgical History:  Procedure Laterality Date   COLONOSCOPY  2014   multiple    LAPAROSCOPY  1995   POLYPECTOMY     HPP     OB History     Gravida  1   Para  1   Term      Preterm      AB      Living  1      SAB      IAB      Ectopic      Multiple      Live Births               Home Medications    Prior to Admission medications   Medication Sig Start Date End Date Taking? Authorizing Provider  brompheniramine-pseudoephedrine-DM 30-2-10 MG/5ML syrup Take 5 mLs by mouth 4 (four) times daily as needed. 03/26/23  Yes -Warren, Sadie Haber, NP  atorvastatin (LIPITOR) 10 MG tablet TAKE 1 TABLET(10 MG) BY MOUTH DAILY 01/01/22   Jake Bathe, MD  cetirizine (ZYRTEC ALLERGY) 10 MG  tablet Take 10 mg by mouth as needed for allergies.    [provider]  escitalopram (LEXAPRO) 20 MG tablet Take 20 mg by mouth daily. 08/19/20   [provider]  hydrochlorothiazide (HYDRODIURIL) 25 MG tablet Take 25 mg by mouth daily.    [provider]  losartan (COZAAR) 25 MG tablet TAKE 1 TABLET(25 MG) BY MOUTH DAILY 08/10/22   Jake Bathe, MD  pantoprazole (PROTONIX) 40 MG tablet Take 40 mg by mouth daily. 05/31/22   [provider]    Family History Family History  Problem Relation Age of Onset   Colon cancer Mother    Hypertension Mother    Heart disease Mother    Diabetes Mother    Hypertension Father    Heart disease Father    Crohn's disease Sister    Hypertension Sister    Heart disease Sister    Multiple sclerosis Sister    Kidney disease Neg Hx    Liver disease Neg Hx    Colon polyps Neg Hx    Esophageal cancer Neg Hx    Rectal cancer  Neg Hx    Stomach cancer Neg Hx     Social History Social History   Tobacco Use   Smoking status: Never   Smokeless tobacco: Never  Vaping Use   Vaping status: Never Used  Substance Use Topics   Alcohol use: Yes    Alcohol/week: 0.0 standard drinks of alcohol    Comment: Rare   Drug use: No     Allergies   Amlodipine, Flagyl [metronidazole hcl], Sulfa antibiotics, and Tetracyclines & related   Review of Systems Review of Systems Per HPI  Physical Exam Triage Vital Signs ED Triage Vitals  Encounter Vitals Group     BP 03/26/23 0948 129/67     Systolic BP Percentile --      Diastolic BP Percentile --      Pulse Rate 03/26/23 0948 95     Resp 03/26/23 0948 16     Temp 03/26/23 0948 99.8 F (37.7 C)     Temp Source 03/26/23 0948 Oral     SpO2 03/26/23 0948 94 %     Weight --      Height --      Head Circumference --      Peak Flow --      Pain Score 03/26/23 0949 6     Pain Loc --      Pain Education --      Exclude from Growth Chart --    No data found.  Updated  Vital Signs BP 129/67 (BP Location: Right Arm)   Pulse 95   Temp 99.8 F (37.7 C) (Oral)   Resp 16   LMP 02/20/2017   SpO2 94%   Visual Acuity Right Eye Distance:   Left Eye Distance:   Bilateral Distance:    Right Eye Near:   Left Eye Near:    Bilateral Near:     Physical Exam Vitals and nursing note reviewed.  Constitutional:      General: She is not in acute distress.    Appearance: Normal appearance.  HENT:     Head: Normocephalic.     Right Ear: Tympanic membrane, ear canal and external ear normal.     Left Ear: Tympanic membrane, ear canal and external ear normal.     Nose: Congestion present. No rhinorrhea.     Right Turbinates: Enlarged and swollen.     Left Turbinates: Enlarged and swollen.     Right Sinus: No maxillary sinus tenderness or frontal sinus tenderness.     Left Sinus: No maxillary sinus tenderness or frontal sinus tenderness.     Mouth/Throat:     Lips: Pink.     Mouth: Mucous membranes are moist.     Pharynx: Oropharynx is clear. Uvula midline. Posterior oropharyngeal erythema present. No pharyngeal swelling, oropharyngeal exudate, uvula swelling or postnasal drip.  Eyes:     Extraocular Movements: Extraocular movements intact.     Conjunctiva/sclera: Conjunctivae normal.     Pupils: Pupils are equal, round, and reactive to light.  Cardiovascular:     Rate and Rhythm: Normal rate and regular rhythm.     Pulses: Normal pulses.     Heart sounds: Normal heart sounds.  Pulmonary:     Effort: Pulmonary effort is normal. No respiratory distress.     Breath sounds: Normal breath sounds. No stridor. No wheezing, rhonchi or rales.  Abdominal:     General: Bowel sounds are normal.     Palpations: Abdomen is soft.     Tenderness: There is  no abdominal tenderness.  Musculoskeletal:     Cervical back: Normal range of motion.  Lymphadenopathy:     Cervical: No cervical adenopathy.  Skin:    General: Skin is warm and dry.  Neurological:     General:  No focal deficit present.     Mental Status: She is alert and oriented to person, place, and time.  Psychiatric:        Mood and Affect: Mood normal.        Behavior: Behavior normal.      UC Treatments / Results  Labs (all labs ordered are listed, but only abnormal results are displayed) Labs Reviewed - No data to display  EKG   Radiology No results found.  Procedures Procedures (including critical care time)  Medications Ordered in UC Medications - No data to display  Initial Impression / Assessment and Plan / UC Course  I have reviewed the triage vital signs and the nursing notes.  Pertinent labs & imaging results that were available during my care of the patient were reviewed by me and considered in my medical decision making (see chart for details).  The patient is well-appearing, she is in no acute distress, vital signs are stable.  Symptoms appear to be consistent with a viral upper respiratory infection with cough.  Patient with negative home COVID test.  Will treat symptomatically with Bromfed-DM for her cough.  Patient advised to use the Flonase that she currently has, and to take the cetirizine as well.  Supportive care recommendations were provided and discussed with the patient to include increasing fluids, allowing for plenty of rest, warm salt water gargles, and normal saline nasal spray for nasal congestion and runny nose.  Patient advised that if symptoms are not improving over the next 7 to 10 days, or if symptoms suddenly worsen, recommend following up in this clinic or with her primary care physician for further evaluation.  Patient is in agreement with this plan of care and verbalizes understanding.  All questions were answered.  Patient stable for discharge.  Work note was provided.  Final Clinical Impressions(s) / UC Diagnoses   Final diagnoses:  Viral upper respiratory tract infection with cough     Discharge Instructions      Take medication as  prescribed. Increase fluids and allow for plenty of rest. May take over-the-counter Tylenol or ibuprofen as needed for pain, fever, general discomfort. Warm salt water gargles as needed for throat pain or discomfort. Recommend normal saline nasal spray throughout the day to help with nasal congestion and runny nose. For your cough, recommend sleeping elevated on pillows and using a humidifier in your bedroom at nighttime. As discussed, if symptoms or not improving over the next 7 to 10 days, or if they suddenly worsen, please follow-up in this clinic or with your primary care physician for further evaluation. Follow-up as needed.     ED Prescriptions     Medication Sig Dispense Auth. Provider   brompheniramine-pseudoephedrine-DM 30-2-10 MG/5ML syrup Take 5 mLs by mouth 4 (four) times daily as needed. 140 mL -Warren, Sadie Haber, NP      PDMP not reviewed this encounter.   Abran Cantor, NP 03/26/23 1006

## 2023-03-26 NOTE — Discharge Instructions (Signed)
Take medication as prescribed. Increase fluids and allow for plenty of rest. May take over-the-counter Tylenol or ibuprofen as needed for pain, fever, general discomfort. Warm salt water gargles as needed for throat pain or discomfort. Recommend normal saline nasal spray throughout the day to help with nasal congestion and runny nose. For your cough, recommend sleeping elevated on pillows and using a humidifier in your bedroom at nighttime. As discussed, if symptoms or not improving over the next 7 to 10 days, or if they suddenly worsen, please follow-up in this clinic or with your primary care physician for further evaluation. Follow-up as needed.

## 2023-06-10 ENCOUNTER — Telehealth: Payer: Self-pay | Admitting: Gastroenterology

## 2023-06-10 NOTE — Telephone Encounter (Signed)
Patient called stated she has been experiencing rectal bleeding since yesterday. Seeking advise.

## 2023-06-10 NOTE — Telephone Encounter (Signed)
Patient called in with complaints of bright red rectal bleeding that happened yesterday evening, and has been occurring intermittently over the last few months. Denies any pain. She was suppose to have an appointment with [place] back in April for abdominal pain & bloating, however states that has improved since starting pantoprazole. She has not been seen for an OV since 2015. She did have screening colon in 2020. Scheduled her for OV on 06/14/23 at 11:00 am with [place], [place]. Advised her that in the meantime if symptoms worsen prior to appointment then she would need to be seen at ED. Pt verbalized all understanding.

## 2023-06-14 ENCOUNTER — Other Ambulatory Visit (INDEPENDENT_AMBULATORY_CARE_PROVIDER_SITE_OTHER): Payer: 59

## 2023-06-14 ENCOUNTER — Encounter: Payer: Self-pay | Admitting: Physician Assistant

## 2023-06-14 ENCOUNTER — Ambulatory Visit: Payer: 59 | Admitting: Physician Assistant

## 2023-06-14 VITALS — BP 100/80 | HR 73 | Ht 60.0 in | Wt 130.2 lb

## 2023-06-14 DIAGNOSIS — K921 Melena: Secondary | ICD-10-CM | POA: Diagnosis not present

## 2023-06-14 DIAGNOSIS — G8929 Other chronic pain: Secondary | ICD-10-CM

## 2023-06-14 DIAGNOSIS — Z800 Family history of malignant neoplasm of digestive organs: Secondary | ICD-10-CM | POA: Diagnosis not present

## 2023-06-14 DIAGNOSIS — R1031 Right lower quadrant pain: Secondary | ICD-10-CM

## 2023-06-14 LAB — CBC WITH DIFFERENTIAL/PLATELET
Basophils Absolute: 0.1 10*3/uL (ref 0.0–0.1)
Basophils Relative: 0.8 % (ref 0.0–3.0)
Eosinophils Absolute: 0.2 10*3/uL (ref 0.0–0.7)
Eosinophils Relative: 2.6 % (ref 0.0–5.0)
HCT: 41.2 % (ref 36.0–46.0)
Hemoglobin: 13.6 g/dL (ref 12.0–15.0)
Lymphocytes Relative: 23.5 % (ref 12.0–46.0)
Lymphs Abs: 1.6 10*3/uL (ref 0.7–4.0)
MCHC: 33.1 g/dL (ref 30.0–36.0)
MCV: 93.5 fL (ref 78.0–100.0)
Monocytes Absolute: 0.5 10*3/uL (ref 0.1–1.0)
Monocytes Relative: 6.8 % (ref 3.0–12.0)
Neutro Abs: 4.6 10*3/uL (ref 1.4–7.7)
Neutrophils Relative %: 66.3 % (ref 43.0–77.0)
Platelets: 222 10*3/uL (ref 150.0–400.0)
RBC: 4.4 Mil/uL (ref 3.87–5.11)
RDW: 12.9 % (ref 11.5–15.5)
WBC: 6.9 10*3/uL (ref 4.0–10.5)

## 2023-06-14 LAB — COMPREHENSIVE METABOLIC PANEL
ALT: 23 U/L (ref 0–35)
AST: 22 U/L (ref 0–37)
Albumin: 4.8 g/dL (ref 3.5–5.2)
Alkaline Phosphatase: 92 U/L (ref 39–117)
BUN: 16 mg/dL (ref 6–23)
CO2: 30 meq/L (ref 19–32)
Calcium: 9.9 mg/dL (ref 8.4–10.5)
Chloride: 98 meq/L (ref 96–112)
Creatinine, Ser: 0.53 mg/dL (ref 0.40–1.20)
GFR: 102.9 mL/min (ref >60.00)
Glucose, Bld: 98 mg/dL (ref 70–99)
Potassium: 3.9 meq/L (ref 3.5–5.1)
Sodium: 139 meq/L (ref 135–145)
Total Bilirubin: 0.6 mg/dL (ref 0.2–1.2)
Total Protein: 7.7 g/dL (ref 6.0–8.3)

## 2023-06-14 LAB — IBC + FERRITIN
Ferritin: 75.7 ng/mL (ref 10.0–291.0)
Iron: 84 ug/dL (ref 42–145)
Saturation Ratios: 19.2 % — ABNORMAL LOW (ref 20.0–50.0)
TIBC: 438.2 ug/dL (ref 250.0–450.0)
Transferrin: 313 mg/dL (ref 212.0–360.0)

## 2023-06-14 MED ORDER — NA SULFATE-K SULFATE-MG SULF 17.5-3.13-1.6 GM/177ML PO SOLN: 1.0000 | ORAL | 0 refills | Status: DC

## 2023-06-14 NOTE — Patient Instructions (Signed)
Your provider has requested that you go to the basement level for lab work before leaving today. Press "B" on the elevator. The lab is located at the first door on the left as you exit the elevator.  Due to recent changes in healthcare laws, you may see the results of your imaging and laboratory studies on MyChart before your provider has had a chance to review them.  We understand that in some cases there may be results that are confusing or concerning to you. Not all laboratory results come back in the same time frame and the provider may be waiting for multiple results in order to interpret others.  Please give [place] 48 hours in order for your provider to thoroughly review all the results before contacting the office for clarification of your results.   You have been scheduled for a colonoscopy. Please follow written instructions given to you at your visit today.   Please pick up your prep supplies at the pharmacy within the next 1-3 days.  If you use inhalers (even only as needed), please bring them with you on the day of your procedure.  DO NOT TAKE 7 DAYS PRIOR TO TEST- Trulicity (dulaglutide) Ozempic, Wegovy (semaglutide) Mounjaro (tirzepatide) Bydureon Bcise (exanatide extended release)  DO NOT TAKE 1 DAY PRIOR TO YOUR TEST Rybelsus (semaglutide) Adlyxin (lixisenatide) Victoza (liraglutide) Byetta (exanatide) ___________________________________________________________________________  I appreciate the opportunity to care for you. Hyacinth Meeker PA-C

## 2023-06-14 NOTE — Progress Notes (Signed)
____________________________________________________________  Attending physician addendum:  Thank you for sending this case to me. I have reviewed the entire note and agree with the plan.  Today's labs are reassuringly normal.  Amada Jupiter, MD  ____________________________________________________________

## 2023-06-14 NOTE — Progress Notes (Signed)
Chief Complaint: Rectal bleeding  HPI:    Stephanie Thomas is a 57 year old female with a past medical history as listed below including colon cancer in her mother as well as GERD, known to Dr. Myrtie Neither, who was referred to me by Assunta Found, MD for a complaint of rectal bleeding.      03/29/2019 colonoscopy with one 6 mm polyp in the ascending colon and otherwise normal.  Pathology showed tubular adenoma.  Repeat recommended in 5 years given family history.    06/10/2023 patient called clinic and described rectal bleeding that had happened the evening before and occurring intermittently over the past few months, apparently was post to have an appoint with [place] in April for abdominal pain and bloating but that was better with Pantoprazole.    Today, patient presents to clinic and tells me that over the past 3 months she has had occasional bright red blood in the stool when she is wiping on the toilet paper but this worsened to 2-3 times over the past week and then on Sunday, 06/12/2023 patient had a large amount of bleeding at least 5 times.  It started as a massive amount of bright red blood that felt like diarrhea and then eventually slowed and started seeing some clots.  She has had no further bleeding since, but all of this scared her a lot.  Still having a little bit of diarrhea but tells me that is mostly normal for her.  Continues with right lower quadrant abdominal pain but also chronic for her.    Denies fever, chills or weight loss.  Past Medical History:  Diagnosis Date   Allergy    SEASONAL   Family hx of colon cancer    mother- late 19's, early 81's   GERD (gastroesophageal reflux disease)    Hyperplastic colon polyp 03/30/2013   Hypertension    Sponge kidney     Past Surgical History:  Procedure Laterality Date   COLONOSCOPY  2014   multiple    LAPAROSCOPY  1995   POLYPECTOMY     HPP     Current Outpatient Medications  Medication Sig Dispense Refill   atorvastatin (LIPITOR)  10 MG tablet TAKE 1 TABLET(10 MG) BY MOUTH DAILY 90 tablet 3   brompheniramine-pseudoephedrine-DM 30-2-10 MG/5ML syrup Take 5 mLs by mouth 4 (four) times daily as needed. 140 mL 0   cetirizine (ZYRTEC ALLERGY) 10 MG tablet Take 10 mg by mouth as needed for allergies.     escitalopram (LEXAPRO) 20 MG tablet Take 20 mg by mouth daily.     hydrochlorothiazide (HYDRODIURIL) 25 MG tablet Take 25 mg by mouth daily.     losartan (COZAAR) 25 MG tablet TAKE 1 TABLET(25 MG) BY MOUTH DAILY 90 tablet 3   pantoprazole (PROTONIX) 40 MG tablet Take 40 mg by mouth daily.     Current Facility-Administered Medications  Medication Dose Route Frequency Provider Last Rate Last Admin   0.9 %  sodium chloride infusion  500 mL Intravenous Once Sherrilyn Rist, MD        Allergies as of 06/14/2023 - Review Complete 03/26/2023  Allergen Reaction Noted   Amlodipine  09/29/2020   Flagyl [metronidazole hcl] Rash 01/19/2011   Sulfa antibiotics Rash 01/19/2011   Tetracyclines & related Rash 01/19/2011    Family History  Problem Relation Age of Onset   Colon cancer Mother    Hypertension Mother    Heart disease Mother    Diabetes Mother    Hypertension  Father    Heart disease Father    Crohn's disease Sister    Hypertension Sister    Heart disease Sister    Multiple sclerosis Sister    Kidney disease Neg Hx    Liver disease Neg Hx    Colon polyps Neg Hx    Esophageal cancer Neg Hx    Rectal cancer Neg Hx    Stomach cancer Neg Hx     Social History   Socioeconomic History   Marital status: Married    Spouse name: Not on file   Number of children: Not on file   Years of education: Not on file   Highest education level: Not on file  Occupational History   Not on file  Tobacco Use   Smoking status: Never   Smokeless tobacco: Never  Vaping Use   Vaping status: Never Used  Substance and Sexual Activity   Alcohol use: Yes    Alcohol/week: 0.0 standard drinks of alcohol    Comment: Rare   Drug  use: No   Sexual activity: Yes    Partners: Male    Birth control/protection: Other-see comments    Comment: vasectomy-1st intercourse 16 yo-5 partners  Other Topics Concern   Not on file  Social History Narrative   Not on file   Social Determinants of Health   Financial Resource Strain: Not on file  Food Insecurity: Not on file  Transportation Needs: Not on file  Physical Activity: Not on file  Stress: Not on file  Social Connections: Not on file  Intimate Partner Violence: Not on file    Review of Systems:    Constitutional: No weight loss, fever or chills Skin: No rash  Cardiovascular: No chest pain Respiratory: No SOB Gastrointestinal: See HPI and otherwise negative Genitourinary: No dysuria Neurological: No headache, dizziness or syncope Musculoskeletal: No new muscle or joint pain Hematologic: No bruising Psychiatric: No history of depression or anxiety   Physical Exam:  Vital signs: BP 100/80 (BP Location: Left Arm, Patient Position: Sitting, Cuff Size: Normal)   Pulse 73   Ht 5' (1.524 m)   Wt 130 lb 4 oz (59.1 kg)   LMP 02/20/2017   BMI 25.44 kg/m    Constitutional:   Pleasant Caucasian female appears to be in NAD, Well developed, Well nourished, alert and cooperative Head:  Normocephalic and atraumatic. Eyes:   PEERL, EOMI. No icterus. Conjunctiva pink. Ears:  Normal auditory acuity. Neck:  Supple Throat: Oral cavity and pharynx without inflammation, swelling or lesion.  Respiratory: Respirations even and unlabored. Lungs clear to auscultation bilaterally.   No wheezes, crackles, or rhonchi.  Cardiovascular: Normal S1, S2. No MRG. Regular rate and rhythm. No peripheral edema, cyanosis or pallor.  Gastrointestinal:  Soft, nondistended, moderate right lower quadrant TTP with involuntary guarding. Normal bowel sounds. No appreciable masses or hepatomegaly. Rectal:  Declined Msk:  Symmetrical without gross deformities. Without edema, no deformity or joint  abnormality.  Neurologic:  Alert and  oriented x4;  grossly normal neurologically.  Skin:   Dry and intact without significant lesions or rashes. Psychiatric: Oriented to person, place and time. Demonstrates good judgement and reason without abnormal affect or behaviors.  RELEVANT LABS AND IMAGING: CBC    Component Value Date/Time   WBC 4.5 12/31/2020 0847   WBC 6.2 08/15/2013 1125   RBC 4.46 12/31/2020 0847   RBC 4.40 08/15/2013 1125   HGB 14.0 12/31/2020 0847   HCT 41.8 12/31/2020 0847   PLT 197 12/31/2020 0847  MCV 94 12/31/2020 0847   MCH 31.4 12/31/2020 0847   MCH 30.0 08/15/2013 1125   MCHC 33.5 12/31/2020 0847   MCHC 33.7 08/15/2013 1125   RDW 12.0 12/31/2020 0847   LYMPHSABS 1.5 08/15/2013 1125   MONOABS 0.4 08/15/2013 1125   EOSABS 0.1 08/15/2013 1125   BASOSABS 0.0 08/15/2013 1125    CMP     Component Value Date/Time   NA 141 12/31/2020 0847   K 4.1 12/31/2020 0847   CL 99 12/31/2020 0847   CO2 26 12/31/2020 0847   GLUCOSE 96 12/31/2020 0847   GLUCOSE 84 08/15/2013 1125   BUN 17 12/31/2020 0847   CREATININE 0.66 12/31/2020 0847   CREATININE 0.60 08/15/2013 1125   CALCIUM 10.2 12/31/2020 0847   PROT 7.3 12/31/2020 0847   ALBUMIN 5.0 (H) 12/31/2020 0847   AST 19 12/31/2020 0847   ALT 19 12/31/2020 0847   ALKPHOS 93 12/31/2020 0847   BILITOT 0.3 12/31/2020 0847   GFRNONAA 92 09/29/2020 1601   GFRAA 106 09/29/2020 1601    Assessment: 1.  Hematochezia: Off-and-on bleeding over the past 2 to 3 months which sounds rectal, then increased/worsened about 2 days ago with massive amounts of bright red blood per rectum and eventual clots, none over the past 48 hours, no abdominal pain, last colonoscopy in 2020 with no diverticula seen, family history of colon cancer in her mom and family history of IBD in her sister; concern for diverticular bleed versus polyp versus other 2.  Chronic right lower quadrant pain: Patient blames on gynecological issues and has always  had tenderness in this area; consider GI source as well? 3.  Family history of colon cancer: In her mother is in her 40s/50s  Plan: 1.  Discussed options with the patient.  This sounds like a diverticular bleed though there were no diverticula seen on her last colonoscopy.  She has had no bleeding over the past 48 hours.  With a family history of colon cancer she is very nervous and would like to proceed with colonoscopy.  Declined rectal exam today.  She will be scheduled for diagnostic colonoscopy in the [facility] with Dr. Myrtie Neither.  Did provide the patient a detailed list of risks for the procedure and she agrees to proceed. Patient is appropriate for endoscopic procedure(s) in the ambulatory ([facility]) setting.  2.  Patient will have a CBC, CMP and iron studies with ferritin today 3.  Discussed possibility of diverticular bleed with the patient. 4.  Patient to follow in clinic per recommendations after time of procedure.  Hyacinth Meeker, PA-C  Gastroenterology 06/14/2023, 10:53 AM  Cc: Assunta Found, MD

## 2023-06-29 ENCOUNTER — Ambulatory Visit: Payer: 59 | Admitting: Gastroenterology

## 2023-06-29 ENCOUNTER — Encounter: Payer: Self-pay | Admitting: Gastroenterology

## 2023-06-29 VITALS — BP 116/60 | HR 60 | Temp 97.3°F | Resp 11 | Ht 60.0 in | Wt 130.0 lb

## 2023-06-29 DIAGNOSIS — D120 Benign neoplasm of cecum: Secondary | ICD-10-CM

## 2023-06-29 DIAGNOSIS — R197 Diarrhea, unspecified: Secondary | ICD-10-CM

## 2023-06-29 DIAGNOSIS — K529 Noninfective gastroenteritis and colitis, unspecified: Secondary | ICD-10-CM | POA: Diagnosis not present

## 2023-06-29 DIAGNOSIS — K921 Melena: Secondary | ICD-10-CM | POA: Diagnosis not present

## 2023-06-29 MED ORDER — SODIUM CHLORIDE 0.9 % IV SOLN: 500.0000 mL | Freq: Once | INTRAVENOUS | Status: DC

## 2023-06-29 NOTE — Progress Notes (Signed)
No significant changes to clinical history since GI office visit on 06/14/23. No significant changes were identified.  The patient continues to be an appropriate candidate for the planned procedure and anesthesia.  Amada Jupiter, MD

## 2023-06-29 NOTE — Progress Notes (Signed)
Sedate, gd SR, tolerated procedure well, VSS, report to RN 

## 2023-06-29 NOTE — Op Note (Signed)
[facility] Patient Name: Stephanie Thomas Procedure Date: 06/29/2023 10:40 AM MRN: [id number] Endoscopist: Sherilyn Cooter L. Myrtie Neither , MD, [id number] Age: 57 Referring MD:  Date of Birth: 08-22-1966 Gender: Female Account #: [id number] Procedure:                Colonoscopy Indications:              Chronic diarrhea (years), Hematochezia                            (intermittently for months)                           Clinical details and recent office consult note                           Diminutive adenomatous polyp on last colonoscopy                            August 2020-history of colon cancer in mother Medicines:                Monitored Anesthesia Care Procedure:                Pre-Anesthesia Assessment:                           - Prior to the procedure, a History and Physical                            was performed, and patient medications and                            allergies were reviewed. The patient's tolerance of                            previous anesthesia was also reviewed. The risks                            and benefits of the procedure and the sedation                            options and risks were discussed with the patient.                            All questions were answered, and informed consent                            was obtained. Prior Anticoagulants: The patient has                            taken no anticoagulant or antiplatelet agents. ASA                            Grade Assessment: II - A patient with mild systemic  disease. After reviewing the risks and benefits,                            the patient was deemed in satisfactory condition to                            undergo the procedure.                           After obtaining informed consent, the colonoscope                            was passed under direct vision. Throughout the                            procedure, the patient's blood pressure, pulse, and                             oxygen saturations were monitored continuously. The                            CF [id number] #[id number] was introduced through the anus                            and advanced to the the terminal ileum, with                            identification of the appendiceal orifice and IC                            valve. The colonoscopy was performed without                            difficulty. The patient tolerated the procedure                            well. The quality of the bowel preparation was                            excellent. The terminal ileum, ileocecal valve,                            appendiceal orifice, and rectum were photographed.                            The bowel preparation used was SUPREP. Scope In: 10:48:03 AM Scope Out: 11:02:02 AM Scope Withdrawal Time: 0 hours 10 minutes 25 seconds  Total Procedure Duration: 0 hours 13 minutes 59 seconds  Findings:                 The perianal and digital rectal examinations were                            normal.  The terminal ileum appeared normal. (Deeply                            intubated)                           Repeat examination of right colon under NBI                            performed.                           A diminutive polyp was found in the cecum. The                            polyp was flat. The polyp was removed with a cold                            biopsy forceps. Resection and retrieval were                            complete.                           Normal mucosa was found in the entire colon.                            Biopsies for histology were taken with a cold                            forceps from the right colon and left colon for                            evaluation of microscopic colitis.                           Internal hemorrhoids were found. The hemorrhoids                            were small and Grade I (internal hemorrhoids that                             do not prolapse).                           The exam was otherwise without abnormality on                            direct and retroflexion views. Complications:            No immediate complications. Estimated Blood Loss:     Estimated blood loss was minimal. Impression:               - The examined portion of the ileum was normal.                           -  One diminutive polyp in the cecum, removed with a                            cold biopsy forceps. Resected and retrieved.                           - Normal mucosa in the entire examined colon.                            Biopsied.                           - Internal hemorrhoids.                           - The examination was otherwise normal on direct                            and retroflexion views.                           Although the visualized internal hemorrhoids are                            small, probable flares of them are the only                            apparent cause of recent rectal bleeding. Recommendation:           - Patient has a contact number available for                            emergencies. The signs and symptoms of potential                            delayed complications were discussed with the                            patient. Return to normal activities tomorrow.                            Written discharge instructions were provided to the                            patient.                           - Resume previous diet.                           - Continue present medications.                           - Await pathology results.                           - Repeat colonoscopy in 5 years  for polyp                            surveillance and family history of colon cancer.                           - Preparation H suppository: Insert rectally twice                            daily as necessary during episodes of rectal                            bleeding.                            - A clinic follow-up appointment will be arranged                            to address reported diarrhea, review pathology                            results and discuss hemorrhoidal treatment                            including banding if necessary. With hopeful                            control of the reported diarrhea, the hemorrhoidal                            bleeding may subside or cease. Gwynn Chalker L. Myrtie Neither, MD 06/29/2023 11:12:32 AM This report has been signed electronically.

## 2023-06-29 NOTE — Progress Notes (Signed)
Called to room to assist during endoscopic procedure.  Patient ID and intended procedure confirmed with present staff. Received instructions for my participation in the procedure from the performing physician.  

## 2023-06-29 NOTE — Patient Instructions (Signed)
Educational handout provided to patient related to Hemorrhoids, Polyps, and Hemorrhoid banding  Resume previous diet  Continue present medications. Preparation H Suppository: Insert rectally twice daily as necessary during episodes of rectal bleeding.  Repeat colonoscopy in 5 years for polyp surveillance.  Awaiting pathology results   YOU HAD AN ENDOSCOPIC PROCEDURE TODAY AT THE [facility]:   Refer to the procedure report that was given to you for any specific questions about what was found during the examination.  If the procedure report does not answer your questions, please call your gastroenterologist to clarify.  If you requested that your care partner not be given the details of your procedure findings, then the procedure report has been included in a sealed envelope for you to review at your convenience later.  YOU SHOULD EXPECT: Some feelings of bloating in the abdomen. Passage of more gas than usual.  Walking can help get rid of the air that was put into your GI tract during the procedure and reduce the bloating. If you had a lower endoscopy (such as a colonoscopy or flexible sigmoidoscopy) you may notice spotting of blood in your stool or on the toilet paper. If you underwent a bowel prep for your procedure, you may not have a normal bowel movement for a few days.  Please Note:  You might notice some irritation and congestion in your nose or some drainage.  This is from the oxygen used during your procedure.  There is no need for concern and it should clear up in a day or so.  SYMPTOMS TO REPORT IMMEDIATELY:  Following lower endoscopy (colonoscopy or flexible sigmoidoscopy):  Excessive amounts of blood in the stool  Significant tenderness or worsening of abdominal pains  Swelling of the abdomen that is new, acute  Fever of 100F or higher   For urgent or emergent issues, a gastroenterologist can be reached at any hour by calling [phone]. Do not use MyChart  messaging for urgent concerns.    DIET:  We do recommend a small meal at first, but then you may proceed to your regular diet.  Drink plenty of fluids but you should avoid alcoholic beverages for 24 hours.  ACTIVITY:  You should plan to take it easy for the rest of today and you should NOT DRIVE or use heavy machinery until tomorrow (because of the sedation medicines used during the test).    FOLLOW UP: Our staff will call the number listed on your records the next business day following your procedure.  We will call around 7:15- 8:00 am to check on you and address any questions or concerns that you may have regarding the information given to you following your procedure. If we do not reach you, we will leave a message.     If any biopsies were taken you will be contacted by phone or by letter within the next 1-3 weeks.  Please call us at [phone] if you have not heard about the biopsies in 3 weeks.    SIGNATURES/CONFIDENTIALITY: You and/or your care partner have signed paperwork which will be entered into your electronic medical record.  These signatures attest to the fact that that the information above on your After Visit Summary has been reviewed and is understood.  Full responsibility of the confidentiality of this discharge information lies with you and/or your care-partner.

## 2023-06-30 ENCOUNTER — Telehealth: Payer: Self-pay

## 2023-06-30 NOTE — Telephone Encounter (Signed)
  Follow up Call-     06/29/2023    9:55 AM  Call back number  Post procedure Call Back phone  # [phone]  Permission to leave phone message Yes     Patient questions:  Do you have a fever, pain , or abdominal swelling? No. Pain Score  0 *  Have you tolerated food without any problems? Yes.    Have you been able to return to your normal activities? Yes.    Do you have any questions about your discharge instructions: Diet   No. Medications  No. Follow up visit  No.  Do you have questions or concerns about your Care? No.  Actions: * If pain score is 4 or above: No action needed, pain <4.

## 2023-07-03 LAB — SURGICAL PATHOLOGY

## 2023-07-08 ENCOUNTER — Other Ambulatory Visit: Payer: Self-pay

## 2023-07-08 DIAGNOSIS — Q430 Meckel's diverticulum (displaced) (hypertrophic): Secondary | ICD-10-CM

## 2023-07-08 DIAGNOSIS — D499 Neoplasm of unspecified behavior of unspecified site: Secondary | ICD-10-CM

## 2023-07-08 DIAGNOSIS — K921 Melena: Secondary | ICD-10-CM

## 2023-07-10 NOTE — Progress Notes (Signed)
Cardiology Office Note:  .   Date:  07/11/2023  ID:  Stephanie Thomas, DOB 02-23-1966, MRN [id number] PCP: Assunta Found, MD  [facility] Providers Cardiologist:  Donato Schultz, MD {  History of Present Illness: .   Stephanie Thomas is a 57 y.o. female with a strong family history of coronary disease evaluated at the request of primary care.  History includes sister having a CABG in her 29s another sister having a CABG as well and her mother at age 87.  Coronary CTA showed 0 plaque in 2022.  Palpitations were occasional and was previously seen in 2010 for evaluation.  Felt tired with these palpitations.  PVCs noted on monitor.  Nuclear stress test was performed 2018 which showed low risk, no ischemia.  She was last seen 05/2022 by Dr. Anne Fu and she reported that since her last visit one of her other sisters had a quadruple CABG.  She is also taking the low-dose of atorvastatin and is tolerating it well.  She did report at that time feeling his palpitations and skipped beats occasionally.  Does not exercise formally and reports trying to eat healthy.  Denies chest pain/pressure, SOB, peripheral edema, lightheadedness, headaches, syncope, orthopnea, and PND.  Today, she presents with a history of hypertension and hyperlipidemia,  for a routine follow-up. She expresses a desire to decrease her medication load, specifically questioning the need for her current statin therapy, Lipitor. She reports no chest pain or shortness of breath. She has no personal history of coronary artery disease but does have a family history. She has been adhering to a Mediterranean diet and has lost over ten pounds since her last visit. She also reports no palpitations or fluttering in the chest.  Reports no shortness of breath nor dyspnea on exertion. Reports no chest pain, pressure, or tightness. No edema, orthopnea, PND. Reports no palpitations.   Discussed the use of AI scribe software for clinical note  transcription with the patient, who gave verbal consent to proceed.   ROS: pertinent ROS in HPI  Studies Reviewed: Marland Kitchen   EKG Interpretation Date/Time:  Monday July 11 2023 08:11:56 EST Ventricular Rate:  66 PR Interval:  152 QRS Duration:  78 QT Interval:  398 QTC Calculation: 417 R Axis:   54  Text Interpretation: Normal sinus rhythm Septal infarct , age undetermined No previous ECGs available Confirmed by Jari Favre [phone]) on 07/11/2023 8:25:34 AM    Long Term Monitor 09/2020: Sinus rhythm with average heart rate of 75 bpm. Normal heart rate variability. No pauses, no atrial fibrillation. Sensation of heart pounding, fluttering was associated with sinus rhythm. Rare PACs, rare PVCs. Benign. 4 brief episodes of paroxysmal atrial tachycardia, fastest interval lasting 8 beats with maximal heart rate 141 bpm. Benign, asymptomatic.   Reassuring monitor   CT Coronary Calcium Score 09/17/2020: IMPRESSION: 1. Coronary calcium score of 0. This was low risk.   2. Normal coronary origin with right dominance.   3. No evidence of CAD.   4. CAD-RADS 0. No evidence of CAD (0%). Consider non-atherosclerotic causes of chest pain.     Nuclear stress test 2018  low risk   EKG:  EKG is personally reviewed.  06/08/2022: Sinus rhythm 74 bpm. Non-specific T-wave changes. 08/28/2020: Sinus rhythm. 79 bpm with subtle T wave inversion noted in aVF.        Physical Exam:   VS:  Ht 5' (1.524 m)   Wt 130 lb 12.8 oz (59.3 kg)   LMP  02/20/2017   SpO2 96%   BMI 25.55 kg/m    Wt Readings from Last 3 Encounters:  07/11/23 130 lb 12.8 oz (59.3 kg)  06/29/23 130 lb (59 kg)  06/14/23 130 lb 4 oz (59.1 kg)    GEN: Well nourished, well developed in no acute distress NECK: No JVD; No carotid bruits CARDIAC: RRR, no murmurs, rubs, gallops RESPIRATORY:  Clear to auscultation without rales, wheezing or rhonchi  ABDOMEN: Soft, non-tender, non-distended EXTREMITIES:  No edema; No deformity    ASSESSMENT AND PLAN: .    Hyperlipidemia Patient expressed desire to decrease medication burden. Last lipid panel showed slightly elevated triglycerides (168) and LDL at goal (78). No personal history of coronary artery disease but has family history. Discussed the possibility of a "statin holiday" after rechecking lipid panel. -Draw fasting lipid panel and liver function tests today. -If lipid panel is within acceptable range, consider discontinuing Lipitor for 6-8 weeks and recheck lipid panel.  Hypertension Well controlled on current regimen of HCTZ and Losartan. -Continue HCTZ and Losartan as prescribed.  Palpitations -none  Weight Management Successful weight loss of over 10 pounds since last year through Mediterranean diet. -Encourage continuation of healthy diet and lifestyle.      Dispo: She can follow-up in a year with Dr. Anne Fu  Signed, Sharlene Dory, PA-C

## 2023-07-11 ENCOUNTER — Ambulatory Visit: Payer: 59 | Attending: Physician Assistant | Admitting: Physician Assistant

## 2023-07-11 ENCOUNTER — Encounter: Payer: Self-pay | Admitting: Physician Assistant

## 2023-07-11 VITALS — Ht 60.0 in | Wt 130.8 lb

## 2023-07-11 DIAGNOSIS — Z8249 Family history of ischemic heart disease and other diseases of the circulatory system: Secondary | ICD-10-CM

## 2023-07-11 DIAGNOSIS — I10 Essential (primary) hypertension: Secondary | ICD-10-CM

## 2023-07-11 DIAGNOSIS — R002 Palpitations: Secondary | ICD-10-CM | POA: Diagnosis not present

## 2023-07-11 LAB — HEPATIC FUNCTION PANEL
ALT: 27 [IU]/L (ref 0–32)
AST: 28 [IU]/L (ref 0–40)
Albumin: 4.7 g/dL (ref 3.8–4.9)
Alkaline Phosphatase: 112 [IU]/L (ref 44–121)
Bilirubin Total: 0.3 mg/dL (ref 0.0–1.2)
Bilirubin, Direct: 0.14 mg/dL (ref 0.00–0.40)
Total Protein: 6.9 g/dL (ref 6.0–8.5)

## 2023-07-11 LAB — LIPID PANEL
Chol/HDL Ratio: 3 ratio (ref 0.0–4.4)
Cholesterol, Total: 165 mg/dL (ref 100–199)
HDL: 55 mg/dL (ref >39)
LDL Chol Calc (NIH): 69 mg/dL (ref 0–99)
Triglycerides: 259 mg/dL — ABNORMAL HIGH (ref 0–149)
VLDL Cholesterol Cal: 41 mg/dL — ABNORMAL HIGH (ref 5–40)

## 2023-07-11 MED ORDER — HYDROCHLOROTHIAZIDE 25 MG PO TABS: 25.0000 mg | ORAL_TABLET | Freq: Every day | ORAL | 3 refills | Status: AC

## 2023-07-11 MED ORDER — LOSARTAN POTASSIUM 25 MG PO TABS: 25.0000 mg | ORAL_TABLET | Freq: Every day | ORAL | 3 refills | Status: AC

## 2023-07-11 MED ORDER — ATORVASTATIN CALCIUM 10 MG PO TABS: 10.0000 mg | ORAL_TABLET | Freq: Every day | ORAL | 3 refills | Status: DC

## 2023-07-11 NOTE — Patient Instructions (Signed)
Medication Instructions:   Your physician recommends that you continue on your current medications as directed. Please refer to the Current Medication list given to you today.   *If you need a refill on your cardiac medications before your next appointment, please call your pharmacy*   Lab Work:  TODAY!!!! LIPID/LFT  If you have labs (blood work) drawn today and your tests are completely normal, you will receive your results only by: MyChart Message (if you have MyChart) OR A paper copy in the mail If you have any lab test that is abnormal or we need to change your treatment, we will call you to review the results.   Testing/Procedures:  None ordered.    Follow-Up: At [facility], you and your health needs are our priority.  As part of our continuing mission to provide you with exceptional heart care, we have created designated Provider Care Teams.  These Care Teams include your primary Cardiologist (physician) and Advanced Practice Providers (APPs -  Physician Assistants and Nurse Practitioners) who all work together to provide you with the care you need, when you need it.  We recommend signing up for the patient portal called "MyChart".  Sign up information is provided on this After Visit Summary.  MyChart is used to connect with patients for Virtual Visits (Telemedicine).  Patients are able to view lab/test results, encounter notes, upcoming appointments, etc.  Non-urgent messages can be sent to your provider as well.   To learn more about what you can do with MyChart, go to [email or web address].    Your next appointment:   1 year(s)  Provider:   Donato Schultz, MD     Other Instructions  Your physician wants you to follow-up in: 1 year.  You will receive a reminder letter in the mail two months in advance. If you don't receive a letter, please call our office to schedule the follow-up appointment.

## 2023-07-26 ENCOUNTER — Ambulatory Visit (HOSPITAL_COMMUNITY): Payer: 59

## 2023-09-05 ENCOUNTER — Ambulatory Visit (HOSPITAL_COMMUNITY): Payer: 59

## 2023-09-07 ENCOUNTER — Other Ambulatory Visit: Payer: Self-pay | Admitting: Family Medicine

## 2023-09-07 DIAGNOSIS — Z1231 Encounter for screening mammogram for malignant neoplasm of breast: Secondary | ICD-10-CM

## 2023-09-09 ENCOUNTER — Ambulatory Visit
Admission: RE | Admit: 2023-09-09 | Discharge: 2023-09-09 | Disposition: A | Discharge status: Home / Self Care | Payer: 59 | Source: Ambulatory Visit | Attending: Family Medicine | Admitting: Family Medicine

## 2023-09-09 DIAGNOSIS — Z1231 Encounter for screening mammogram for malignant neoplasm of breast: Secondary | ICD-10-CM

## 2023-10-07 ENCOUNTER — Ambulatory Visit: Payer: 59 | Admitting: Gastroenterology

## 2023-11-30 ENCOUNTER — Encounter: Payer: 59 | Admitting: Obstetrics and Gynecology

## 2024-01-09 ENCOUNTER — Other Ambulatory Visit (HOSPITAL_COMMUNITY)
Admission: RE | Admit: 2024-01-09 | Discharge: 2024-01-09 | Disposition: A | Discharge status: Home / Self Care | Source: Ambulatory Visit | Attending: Obstetrics and Gynecology | Admitting: Obstetrics and Gynecology

## 2024-01-09 ENCOUNTER — Encounter: Payer: Self-pay | Admitting: Obstetrics and Gynecology

## 2024-01-09 ENCOUNTER — Ambulatory Visit: Admitting: Obstetrics and Gynecology

## 2024-01-09 VITALS — BP 108/64 | HR 78 | Temp 98.0°F | Ht 59.75 in | Wt 139.0 lb

## 2024-01-09 DIAGNOSIS — E2839 Other primary ovarian failure: Secondary | ICD-10-CM

## 2024-01-09 DIAGNOSIS — Z1331 Encounter for screening for depression: Secondary | ICD-10-CM

## 2024-01-09 DIAGNOSIS — R350 Frequency of micturition: Secondary | ICD-10-CM

## 2024-01-09 DIAGNOSIS — Z01419 Encounter for gynecological examination (general) (routine) without abnormal findings: Secondary | ICD-10-CM

## 2024-01-09 MED ORDER — INTRAROSA 6.5 MG VA INST
1.0000 | VAGINAL_INSERT | Freq: Every evening | VAGINAL | 12 refills | Status: AC | PRN
Start: 2024-01-09 — End: ?

## 2024-01-09 NOTE — Addendum Note (Signed)
 Addended by: Reinaldo Caras on: 01/09/2024 04:25 PM   Modules accepted: Orders

## 2024-01-09 NOTE — Progress Notes (Signed)
 58 y.o. y.o. female here for annual exam. Patient's last menstrual period was 02/20/2017.  . New/established gyn, aex//jj  PAP: 2023 per pt, Abnormal pap: some remote history and then normal after  MMG: 09-09-23, BMD: 03-05-22  -0.9 two year follow-up ordered  COLONOSCOPY: 06-29-23 Pt c/o pelvic tenderness & urinary frequency  No HRT use, no VB Some urgency but no dysuria. Has had UTI's h/o. Patient agreed to wait for UC for treatment. To try intrarosa  for vaginal dryness and help bladder support. Discussed insurance may not pay. No other treatments tried. Body mass index is 27.37 kg/m.     01/09/2024    3:16 PM  Depression screen PHQ 2/9  Decreased Interest 0  Down, Depressed, Hopeless 0  PHQ - 2 Score 0    Blood pressure 108/64, pulse 78, temperature 98 F (36.7 C), temperature source Oral, height 4' 11.75" (1.518 m), weight 139 lb (63 kg), last menstrual period 02/20/2017, SpO2 98%.  No results found for: "DIAGPAP", "HPVHIGH", "ADEQPAP"  GYN HISTORY: No results found for: "DIAGPAP", "HPVHIGH", "ADEQPAP"  OB History  Gravida Para Term Preterm AB Living  1 1    1   SAB IAB Ectopic Multiple Live Births          # Outcome Date GA Lbr Len/2nd Weight Sex Type Anes PTL Lv  1 Para             Past Medical History:  Diagnosis Date   Allergy    SEASONAL   Family hx of colon cancer    mother- late 57's, early 23's   GERD (gastroesophageal reflux disease)    Hyperplastic colon polyp 03/30/2013   Hypertension    Sponge kidney     Past Surgical History:  Procedure Laterality Date   COLONOSCOPY  2014   multiple    LAPAROSCOPY  1995   POLYPECTOMY     HPP     Current Outpatient Medications on File Prior to Visit  Medication Sig Dispense Refill   atorvastatin  (LIPITOR) 10 MG tablet Take 1 tablet (10 mg total) by mouth daily. 90 tablet 3   buPROPion (WELLBUTRIN XL) 150 MG 24 hr tablet Take 150 mg by mouth daily.     cetirizine (ZYRTEC ALLERGY) 10 MG tablet Take 10  mg by mouth as needed for allergies.     escitalopram (LEXAPRO) 20 MG tablet Take 20 mg by mouth daily.     fluticasone (FLONASE) 50 MCG/ACT nasal spray Place 2 sprays into both nostrils daily.     hydrochlorothiazide  (HYDRODIURIL ) 25 MG tablet Take 1 tablet (25 mg total) by mouth daily. 90 tablet 3   levocetirizine (XYZAL) 5 MG tablet SMARTSIG:1 Tablet(s) By Mouth Every Evening     losartan  (COZAAR ) 25 MG tablet Take 1 tablet (25 mg total) by mouth daily. 90 tablet 3   pantoprazole (PROTONIX) 40 MG tablet Take 40 mg by mouth daily.     terbinafine (LAMISIL) 250 MG tablet Take 500 mg by mouth daily.     No current facility-administered medications on file prior to visit.    Social History   Socioeconomic History   Marital status: Married    Spouse name: Not on file   Number of children: Not on file   Years of education: Not on file   Highest education level: Not on file  Occupational History   Not on file  Tobacco Use   Smoking status: Never   Smokeless tobacco: Never  Vaping Use   Vaping status:  Never Used  Substance and Sexual Activity   Alcohol use: Not Currently   Drug use: No   Sexual activity: Yes    Partners: Male    Birth control/protection: Other-see comments, Post-menopausal    Comment: vasectomy-1st intercourse 16 yo-5 partners  Other Topics Concern   Not on file  Social History Narrative   Not on file   Social Drivers of Health   Financial Resource Strain: Not on file  Food Insecurity: Not on file  Transportation Needs: Not on file  Physical Activity: Not on file  Stress: Not on file  Social Connections: Not on file  Intimate Partner Violence: Not on file    Family History  Problem Relation Age of Onset   Colon cancer Mother    Hypertension Mother    Heart disease Mother    Diabetes Mother    Hypertension Father    Heart disease Father    Crohn's disease Sister    Hypertension Sister    Heart disease Sister    Multiple sclerosis Sister     Kidney disease Neg Hx    Liver disease Neg Hx    Colon polyps Neg Hx    Esophageal cancer Neg Hx    Rectal cancer Neg Hx    Stomach cancer Neg Hx    Breast cancer Neg Hx      Allergies  Allergen Reactions   Amlodipine     swelling   Flagyl [Metronidazole Hcl] Rash   Sulfa Antibiotics Rash   Tetracyclines & Related Rash      Patient's last menstrual period was Patient's last menstrual period was 02/20/2017.Aaron Aas            Review of Systems Alls systems reviewed and are negative.     Physical Exam Constitutional:      Appearance: Normal appearance.  Genitourinary:     Vulva and urethral meatus normal.     No lesions in the vagina.     Right Labia: No rash, lesions or skin changes.    Left Labia: No lesions, skin changes or rash.    No vaginal discharge or tenderness.     No vaginal prolapse present.    No vaginal atrophy present.     Right Adnexa: not tender, not palpable and no mass present.    Left Adnexa: not tender, not palpable and no mass present.    No cervical motion tenderness or discharge.     Uterus is not enlarged, tender or irregular.  Breasts:    Right: Normal.     Left: Normal.  HENT:     Head: Normocephalic.  Neck:     Thyroid: No thyroid mass, thyromegaly or thyroid tenderness.  Cardiovascular:     Rate and Rhythm: Normal rate and regular rhythm.     Heart sounds: Normal heart sounds, S1 normal and S2 normal.  Pulmonary:     Effort: Pulmonary effort is normal.     Breath sounds: Normal breath sounds and air entry.  Abdominal:     General: There is no distension.     Palpations: Abdomen is soft. There is no mass.     Tenderness: There is no abdominal tenderness. There is no guarding or rebound.  Musculoskeletal:        General: Normal range of motion.     Cervical back: Full passive range of motion without pain, normal range of motion and neck supple. No tenderness.     Right lower leg: No edema.  Left lower leg: No edema.  Neurological:      Mental Status: She is alert.  Skin:    General: Skin is warm.  Psychiatric:        Mood and Affect: Mood normal.        Behavior: Behavior normal.        Thought Content: Thought content normal.  Vitals and nursing note reviewed. Exam conducted with a chaperone present.       A:         Well Woman GYN exam                             P:        Pap smear collected today Encouraged annual mammogram screening Colon cancer screening up-to-date DXA ordered today Labs and immunizations to do with PMD Discussed breast self exams Encouraged healthy lifestyle practices Encouraged Vit D and Calcium     No follow-ups on file.  Reinaldo Caras

## 2024-01-11 ENCOUNTER — Ambulatory Visit: Payer: Self-pay | Admitting: Obstetrics and Gynecology

## 2024-01-11 LAB — URINALYSIS, COMPLETE W/RFL CULTURE
Bilirubin Urine: NEGATIVE
Glucose, UA: NEGATIVE
Hyaline Cast: NONE SEEN /LPF
Ketones, ur: NEGATIVE
Nitrites, Initial: NEGATIVE
Protein, ur: NEGATIVE
Specific Gravity, Urine: 1.025 (ref 1.001–1.035)
pH: 5.5 (ref 5.0–8.0)

## 2024-01-11 LAB — URINE CULTURE
MICRO NUMBER:: 16472387
Result:: NO GROWTH
SPECIMEN QUALITY:: ADEQUATE

## 2024-01-11 LAB — CULTURE INDICATED

## 2024-01-11 LAB — CYTOLOGY - PAP: Diagnosis: NEGATIVE

## 2024-05-15 ENCOUNTER — Telehealth: Payer: Self-pay | Admitting: Physician Assistant

## 2024-05-15 DIAGNOSIS — K921 Melena: Secondary | ICD-10-CM

## 2024-05-15 MED ORDER — HYDROCORTISONE ACETATE 25 MG RE SUPP
25.0000 mg | Freq: Two times a day (BID) | RECTAL | 1 refills | Status: AC
Start: 2024-05-15 — End: ?

## 2024-05-15 NOTE — Telephone Encounter (Signed)
 CT A/P order originally placed on 07/08/2023. After review of chart, it was noted that Dr. Legrand requested pt to have a CT for Recurrent hematochezia, no clear source on EGD and colonoscopy.  Evaluate for small bowel diverticuli, neoplasia, Meckel's diverticulum Pt reports that she never got this procedure done. Pt has an office visit scheduled next Wednesday, will route to provider to review and advise on procedure.

## 2024-05-15 NOTE — Telephone Encounter (Signed)
 PT is experiencing rectal bleeding and would like to discuss her symptoms. She has scheduled an appointment but it is not until November. Please advise.

## 2024-05-15 NOTE — Telephone Encounter (Signed)
 Spoke with pt. She denies any straining or constipation. Discussed Hydrocortisone  suppositories. Pharmacy verified and rx sent.Pt instructed to call back if bleeding worsens or other symptoms develop. Verbalized understanding.

## 2024-05-15 NOTE — Telephone Encounter (Signed)
 Inbound call from patient stating that she has seen that she missed a scan that was order for her last year and was unsure if she needed to have that scan done. Patient is requesting to speak to [facility]. Please advise.

## 2024-05-15 NOTE — Telephone Encounter (Signed)
 Spoke with pt. She reports that since last Thursday she has noticed moderate amount of blood with bowel movements. She reports there have been several blood clots as well. Denies any other symptoms. Appt scheduled for 05/23/2024 at 1330 with Jessica. Pt would like to know if there is anything else that can be tried prior to appt.

## 2024-05-16 ENCOUNTER — Ambulatory Visit: Payer: Self-pay | Admitting: Gastroenterology

## 2024-05-16 ENCOUNTER — Other Ambulatory Visit (INDEPENDENT_AMBULATORY_CARE_PROVIDER_SITE_OTHER)

## 2024-05-16 DIAGNOSIS — K921 Melena: Secondary | ICD-10-CM

## 2024-05-16 LAB — CBC WITH DIFFERENTIAL/PLATELET
Basophils Absolute: 0 K/uL (ref 0.0–0.1)
Basophils Relative: 0.7 % (ref 0.0–3.0)
Eosinophils Absolute: 0.1 K/uL (ref 0.0–0.7)
Eosinophils Relative: 1.9 % (ref 0.0–5.0)
HCT: 39 % (ref 36.0–46.0)
Hemoglobin: 13.3 g/dL (ref 12.0–15.0)
Lymphocytes Relative: 27.8 % (ref 12.0–46.0)
Lymphs Abs: 1.7 K/uL (ref 0.7–4.0)
MCHC: 34.1 g/dL (ref 30.0–36.0)
MCV: 93 fl (ref 78.0–100.0)
Monocytes Absolute: 0.3 K/uL (ref 0.1–1.0)
Monocytes Relative: 5.5 % (ref 3.0–12.0)
Neutro Abs: 3.8 K/uL (ref 1.4–7.7)
Neutrophils Relative %: 64.1 % (ref 43.0–77.0)
Platelets: 218 K/uL (ref 150.0–400.0)
RBC: 4.19 Mil/uL (ref 3.87–5.11)
RDW: 12.7 % (ref 11.5–15.5)
WBC: 6 K/uL (ref 4.0–10.5)

## 2024-05-16 NOTE — Telephone Encounter (Signed)
 Called and spoke with pt. Discussed previous order for CT A/P and that the provider will further discuss with her at her ov whether this is still necessary. Pt agrees to come in for CBC. Orders placed.

## 2024-05-23 ENCOUNTER — Encounter: Payer: Self-pay | Admitting: Gastroenterology

## 2024-05-23 ENCOUNTER — Ambulatory Visit: Admitting: Gastroenterology

## 2024-05-23 ENCOUNTER — Other Ambulatory Visit (INDEPENDENT_AMBULATORY_CARE_PROVIDER_SITE_OTHER)

## 2024-05-23 VITALS — BP 108/62 | HR 72 | Ht 60.0 in | Wt 139.2 lb

## 2024-05-23 DIAGNOSIS — Q430 Meckel's diverticulum (displaced) (hypertrophic): Secondary | ICD-10-CM | POA: Insufficient documentation

## 2024-05-23 DIAGNOSIS — K921 Melena: Secondary | ICD-10-CM | POA: Insufficient documentation

## 2024-05-23 DIAGNOSIS — K625 Hemorrhage of anus and rectum: Secondary | ICD-10-CM | POA: Insufficient documentation

## 2024-05-23 NOTE — Patient Instructions (Signed)
 Your provider has requested that you go to the basement level for lab work before leaving today. Press B on the elevator. The lab is located at the first door on the left as you exit the elevator.   You have been scheduled for a CT scan of the abdomen and pelvis at [facility] 1st floor Radiology. You are scheduled on Monday 05/28/24 at 10:30 am. You should arrive 15 minutes prior to your appointment time for registration.    Please follow the written instructions below on the day of your exam:   1) Do not eat anything after 8:30 am (4 hours prior to your test)   You may take any medications as prescribed with a small amount of water, if necessary. If you take any of the following medications: METFORMIN, GLUCOPHAGE, GLUCOVANCE, AVANDAMET, RIOMET, FORTAMET, ACTOPLUS MET, JANUMET, GLUMETZA or METAGLIP, you MAY be asked to HOLD this medication 48 hours AFTER the exam.   The purpose of you drinking the oral contrast is to aid in the visualization of your intestinal tract. The contrast solution may cause some diarrhea. Depending on your individual set of symptoms, you may also receive an intravenous injection of x-ray contrast/dye. Plan on being at [facility] for 45 minutes or longer, depending on the type of exam you are having performed.   If you have any questions regarding your exam or if you need to reschedule, you may call [facility] at [phone] between the hours of 8:00 am and 5:00 pm, Monday-Friday.

## 2024-05-23 NOTE — Progress Notes (Signed)
 05/23/2024 Stephanie Thomas [id number] [birth year]   HISTORY OF PRESENT ILLNESS: This is a 58 year old female is a patient of Dr. Clayburn.  She is here today for follow-up of rectal bleeding.  She had rectal bleeding last year, colonoscopy only showed small internal hemorrhoids as below.  Although could be the source of bleeding, with the amount of bleeding they wanted to be sure no other source.  Ordered a CT scan to rule out small bowel diverticulum, Meckel's, etc.  Did not have any recurrent bleeding until recently though so never proceeded with previously ordered CT scan.  Then in September had bleeding again with 11 days of bright red blood, some clots.  Used hydrocortisone  suppositories.  Still has intermittent loose stool, which she had reported previously.  Declined antispasmodics previously.  She thinks it is likely dietary.   Colonoscopy 06/2023:  - The examined portion of the ileum was normal. - One diminutive polyp in the cecum, removed with a cold biopsy forceps. Resected and retrieved. - Normal mucosa in the entire examined colon. Biopsied. - Internal hemorrhoids. - The examination was otherwise normal on direct and retroflexion views.  1. Surgical [P], colon, cecum, polyp (1) :       - POLYPOID COLONIC MUCOSA WITH MILD HYPERPLASTIC CHANGES       - NEGATIVE FOR DYSPLASIA       PENDING LEVELS        2. Surgical [P], random colon :       - COLONIC MUCOSA WITH EDEMA, OTHERWISE UNREMARKABLE       - NEGATIVE FOR ACUTE INFLAMMATION, INCREASED INTRAEPITHELIAL LYMPHOCYTES OR       THICKENED SUBEPITHELIAL COLLAGEN TABLE    Past Medical History:  Diagnosis Date   Allergy    SEASONAL   Family hx of colon cancer    mother- late 16's, early 12's   GERD (gastroesophageal reflux disease)    Hyperplastic colon polyp 03/30/2013   Hypertension    Sponge kidney    Past Surgical History:  Procedure Laterality Date   COLONOSCOPY  2014   multiple    LAPAROSCOPY  1995    POLYPECTOMY     HPP     reports that she has never smoked. She has never used smokeless tobacco. She reports that she does not currently use alcohol. She reports that she does not use drugs. family history includes Colon cancer in her mother; Crohn's disease in her sister; Diabetes in her mother; Heart disease in her father, mother, and sister; Hypertension in her father, mother, and sister; Multiple sclerosis in her sister. Allergies  Allergen Reactions   Amlodipine     swelling   Flagyl [Metronidazole Hcl] Rash   Sulfa Antibiotics Rash   Tetracyclines & Related Rash      Outpatient Encounter Medications as of 05/23/2024  Medication Sig   atorvastatin  (LIPITOR) 10 MG tablet Take 1 tablet (10 mg total) by mouth daily.   buPROPion (WELLBUTRIN XL) 150 MG 24 hr tablet Take 150 mg by mouth daily.   cetirizine (ZYRTEC ALLERGY) 10 MG tablet Take 10 mg by mouth as needed for allergies.   escitalopram (LEXAPRO) 20 MG tablet Take 20 mg by mouth daily.   fluticasone (FLONASE) 50 MCG/ACT nasal spray Place 2 sprays into both nostrils daily.   hydrochlorothiazide  (HYDRODIURIL ) 25 MG tablet Take 1 tablet (25 mg total) by mouth daily.   hydrocortisone  (ANUSOL -HC) 25 MG suppository Place 1 suppository (25 mg total) rectally 2 (two) times daily.  levocetirizine (XYZAL) 5 MG tablet SMARTSIG:1 Tablet(s) By Mouth Every Evening   losartan  (COZAAR ) 25 MG tablet Take 1 tablet (25 mg total) by mouth daily.   pantoprazole (PROTONIX) 40 MG tablet Take 40 mg by mouth daily.   Prasterone  (INTRAROSA ) 6.5 MG INST Place 1 suppository vaginally at bedtime as needed.   terbinafine (LAMISIL) 250 MG tablet Take 500 mg by mouth daily.   No facility-administered encounter medications on file as of 05/23/2024.     REVIEW OF SYSTEMS  : All other systems reviewed and negative except where noted in the History of Present Illness.   PHYSICAL EXAM: BP 108/62 (BP Location: Right Arm, Patient Position: Sitting, Cuff Size:  Normal)   Pulse 72   Ht 5' (1.524 m)   Wt 139 lb 4 oz (63.2 kg)   LMP 02/20/2017   BMI 27.20 kg/m  General: Well developed white female in no acute distress Head: Normocephalic and atraumatic Eyes:  Sclerae anicteric, conjunctiva pink. Ears: Normal auditory acuity Lungs: Clear throughout to auscultation; no W/R/R. Heart: Regular rate and rhythm; no M/R/G. Abdomen: Soft, non-distended.  BS present.  Non-tender. Musculoskeletal: Symmetrical with no gross deformities  Skin: No lesions on visible extremities Extremities: No edema  Neurological: Alert oriented x 4, grossly non-focal Psychological:  Alert and cooperative. Normal mood and affect  ASSESSMENT AND PLAN: *Rectal bleeding: Had rectal bleeding last year, colonoscopy only showed small internal hemorrhoids.  Although could be the source of bleeding, with the amount of bleeding they wanted to be sure no other source.  Did not have any recurrent bleeding until recently though so never proceeded with previously ordered CT scan.  Had 11 days with bright red blood, some clots recently.  Will proceed with CT scan of the abdomen and pelvis with contrast that was ordered previously to rule out small diverticulum, Meckel's, etc.  Otherwise if that is unremarkable would consider it to be hemorrhoidal source and if recurrent maybe could consider banding.  Has hydrocortisone  suppositories to use as needed.   CC:  Marvine Rush, MD

## 2024-05-23 NOTE — Progress Notes (Signed)
 ____________________________________________________________  Attending physician addendum:  Thank you for sending this case to me. I have reviewed the entire note and agree with the plan.   Victory Brand, MD  ____________________________________________________________

## 2024-05-24 LAB — BASIC METABOLIC PANEL WITH GFR
BUN: 14 mg/dL (ref 6–23)
CO2: 29 meq/L (ref 19–32)
Calcium: 10.1 mg/dL (ref 8.4–10.5)
Chloride: 99 meq/L (ref 96–112)
Creatinine, Ser: 0.54 mg/dL (ref 0.40–1.20)
GFR: 101.76 mL/min (ref >60.00)
Glucose, Bld: 95 mg/dL (ref 70–99)
Potassium: 4.2 meq/L (ref 3.5–5.1)
Sodium: 140 meq/L (ref 135–145)

## 2024-05-28 ENCOUNTER — Ambulatory Visit (HOSPITAL_BASED_OUTPATIENT_CLINIC_OR_DEPARTMENT_OTHER)

## 2024-06-04 ENCOUNTER — Encounter (HOSPITAL_BASED_OUTPATIENT_CLINIC_OR_DEPARTMENT_OTHER): Payer: Self-pay

## 2024-06-05 ENCOUNTER — Ambulatory Visit (HOSPITAL_BASED_OUTPATIENT_CLINIC_OR_DEPARTMENT_OTHER)
Admission: RE | Admit: 2024-06-05 | Discharge: 2024-06-05 | Disposition: A | Discharge status: Home / Self Care | Source: Ambulatory Visit | Attending: Gastroenterology | Admitting: Gastroenterology

## 2024-06-05 DIAGNOSIS — K921 Melena: Secondary | ICD-10-CM | POA: Insufficient documentation

## 2024-06-05 MED ORDER — IOHEXOL 300 MG/ML  SOLN
100.0000 mL | Freq: Once | INTRAMUSCULAR | Status: AC | PRN
  Administered 2024-06-05: 100 mL via INTRAVENOUS

## 2024-06-06 ENCOUNTER — Ambulatory Visit: Payer: Self-pay | Admitting: Gastroenterology

## 2024-06-26 ENCOUNTER — Ambulatory Visit: Admitting: Physician Assistant

## 2024-08-02 ENCOUNTER — Other Ambulatory Visit: Payer: Self-pay | Admitting: Physician Assistant

## 2024-08-03 MED ORDER — ATORVASTATIN CALCIUM 10 MG PO TABS: 10.0000 mg | ORAL_TABLET | Freq: Every day | ORAL | 0 refills | Status: DC

## 2024-08-28 ENCOUNTER — Other Ambulatory Visit: Payer: Self-pay | Admitting: Cardiology

## 2024-08-29 ENCOUNTER — Telehealth: Payer: Self-pay | Admitting: Cardiology

## 2024-08-29 NOTE — Telephone Encounter (Signed)
" °*  STAT* If patient is at the pharmacy, call can be transferred to refill team.   1. Which medications need to be refilled? (please list name of each medication and dose if known)   atorvastatin  (LIPITOR) 10 MG tablet    2. Which pharmacy/location (including street and city if local pharmacy) is medication to be sent to?  [id number] - [place], [place] - [address] AT [address]    3. Do they need a 30 day or 90 day supply? 90  "

## 2024-08-31 MED ORDER — ATORVASTATIN CALCIUM 10 MG PO TABS: 10.0000 mg | ORAL_TABLET | Freq: Every day | ORAL | 0 refills | Status: AC

## 2024-08-31 NOTE — Telephone Encounter (Signed)
 RX sent in.

## 2024-09-04 ENCOUNTER — Encounter: Payer: Self-pay | Admitting: Cardiology

## 2024-09-04 ENCOUNTER — Ambulatory Visit: Payer: Self-pay | Attending: Cardiology | Admitting: Cardiology

## 2024-09-04 VITALS — BP 116/68 | HR 65 | Ht 61.0 in | Wt 141.0 lb

## 2024-09-04 DIAGNOSIS — I10 Essential (primary) hypertension: Secondary | ICD-10-CM | POA: Diagnosis not present

## 2024-09-04 DIAGNOSIS — Z8249 Family history of ischemic heart disease and other diseases of the circulatory system: Secondary | ICD-10-CM | POA: Diagnosis not present

## 2024-09-04 NOTE — Patient Instructions (Signed)

## 2024-09-04 NOTE — Progress Notes (Signed)
 " Cardiology Office Note:  .   Date:  09/04/2024  ID:  Stephanie Thomas, DOB [birth year], MRN [id number] PCP: Stephanie Rush, MD  [facility] Providers Cardiologist:  Oneil Parchment, MD     History of Present Illness: .   Stephanie Thomas is a 59 y.o. female Discussed the use of AI scribe History of Present Illness Stephanie Thomas is a 59 year old female who presents for a follow-up visit regarding her cardiovascular health.  She has a significant family history of coronary artery disease, with all three of her sisters and her mother having undergone coronary artery bypass grafting in their 62s. Despite this, a coronary CT scan on September 17, 2020, showed a calcium  score of zero, indicating no evidence of coronary artery disease at that time.  In 2018, a nuclear stress test was performed, which was low risk and showed no ischemia. In 2010, she underwent an evaluation for palpitations, which was unremarkable, although she experienced tiredness associated with premature ventricular contractions. An event monitor in 2022 showed rare premature ventricular contractions, premature atrial contractions, and four brief episodes of paroxysmal atrial tachycardia.  She has been actively modifying her risk factors, including discontinuing caffeine and alcohol consumption. She feels 'tired, tired, tired,' which she attributes to recent lifestyle changes, including quitting caffeine and alcohol in solidarity with her pregnant daughter. She experienced one or two severe headaches after quitting caffeine but does not believe they were related to caffeine withdrawal.  Her current medications include hydrochlorothiazide  and losartan , both at 25 mg. She also takes atorvastatin , and her LDL cholesterol decreased from 141 in 2022 to 69 in 2024. Her triglycerides were slightly elevated at 259 in 2024.  Her blood pressure has been stable, although it was elevated at times, reaching up to 150 mmHg during the day, but it  has since normalized. She is considering the timing of her medication intake, questioning whether it should be taken in the morning or at night.  Daughter is pregnant. She herself quit caffeine and EtOH on the weekends.       Studies Reviewed: SABRA   EKG Interpretation Date/Time:  Tuesday September 04 2024 12:17:20 EST Ventricular Rate:  65 PR Interval:  146 QRS Duration:  78 QT Interval:  384 QTC Calculation: 399 R Axis:   26  Text Interpretation: Normal sinus rhythm Low voltage QRS Nonspecific T wave abnormality When compared with ECG of 11-Jul-2023 08:11, Nonspecific T wave abnormality now evident in Lateral leads Confirmed by Parchment Oneil ([id number]) on 09/04/2024 12:21:58 PM    Results Labs Lipid panel (2024): Triglycerides 259, LDL cholesterol 69; LDL cholesterol 141 in 2022 Creatinine (2024-09-04): 0.54, within normal limits Hemoglobin (2024-09-04): Within normal limits, no anemia Blood glucose (2024-09-04): Within normal limits  Radiology Coronary CT angiography (09/17/2020): Coronary artery calcium  score zero, no evidence of coronary artery disease  Diagnostic Nuclear stress test (2018): Low risk, no ischemia Event monitor (2022): Rare premature ventricular contractions, premature atrial contractions, four brief episodes of paroxysmal atrial tachycardia EKG (09/04/2024): Heart rate 65 beats per minute, nonspecific T wave changes (Independently interpreted) Risk Assessment/Calculations:            Physical Exam:   VS:  BP 116/68   Pulse 65   Ht 5' 1 (1.549 m)   Wt 141 lb (64 kg)   LMP 02/20/2017   SpO2 97%   BMI 26.64 kg/m    Wt Readings from Last 3 Encounters:  09/04/24 141 lb (64 kg)  05/23/24  139 lb 4 oz (63.2 kg)  01/09/24 139 lb (63 kg)    GEN: Well nourished, well developed in no acute distress NECK: No JVD; No carotid bruits CARDIAC: RRR, no murmurs, no rubs, no gallops RESPIRATORY:  Clear to auscultation without rales, wheezing or rhonchi  ABDOMEN: Soft,  non-tender, non-distended EXTREMITIES:  No edema; No deformity   ASSESSMENT AND PLAN: .    Assessment and Plan Assessment & Plan Essential hypertension Blood pressure is well-controlled on losartan  25 mg and hydrochlorothiazide  25 mg. Occasional elevated readings likely due to situational factors. No indication for medication change at this time. - Continue losartan  25 mg daily. - Continue hydrochlorothiazide  25 mg daily. - Encouraged regular blood pressure monitoring.  Hyperlipidemia Family history of CAD Lipid panel from 2024 shows triglycerides at 259 mg/dL and LDL cholesterol at 69 mg/dL, indicating good control with atorvastatin . Family history of coronary artery disease necessitates maintaining LDL below 70 mg/dL. Atorvastatin  is well-tolerated with no significant side effects. - Continue atorvastatin  as prescribed. - Will plan for a calcium  score CT scan in 2027 to assess for coronary artery disease.         Dispo: 1 yr  Signed, Oneil Parchment, MD  "

## 2024-09-27 ENCOUNTER — Other Ambulatory Visit: Payer: Self-pay | Admitting: Family Medicine

## 2024-09-27 DIAGNOSIS — Z1231 Encounter for screening mammogram for malignant neoplasm of breast: Secondary | ICD-10-CM

## 2024-10-10 ENCOUNTER — Ambulatory Visit

## 2024-11-01 ENCOUNTER — Ambulatory Visit (HOSPITAL_BASED_OUTPATIENT_CLINIC_OR_DEPARTMENT_OTHER): Payer: Self-pay

## 2025-01-09 ENCOUNTER — Ambulatory Visit: Admitting: Obstetrics and Gynecology
